# Patient Record
Sex: Female | Born: 1945 | Race: White | Hispanic: No | Marital: Married | Smoking: Former smoker
Health system: Southern US, Community
[De-identification: ages and names within clinical notes are randomized; demographics above are authoritative.]

## PROBLEM LIST (undated history)

## (undated) DIAGNOSIS — M1711 Unilateral primary osteoarthritis, right knee: Secondary | ICD-10-CM

## (undated) DIAGNOSIS — C801 Malignant (primary) neoplasm, unspecified: Secondary | ICD-10-CM

## (undated) DIAGNOSIS — I639 Cerebral infarction, unspecified: Secondary | ICD-10-CM

## (undated) DIAGNOSIS — G4730 Sleep apnea, unspecified: Secondary | ICD-10-CM

## (undated) DIAGNOSIS — E039 Hypothyroidism, unspecified: Secondary | ICD-10-CM

## (undated) DIAGNOSIS — T8859XA Other complications of anesthesia, initial encounter: Secondary | ICD-10-CM

## (undated) DIAGNOSIS — I219 Acute myocardial infarction, unspecified: Secondary | ICD-10-CM

## (undated) DIAGNOSIS — M1990 Unspecified osteoarthritis, unspecified site: Secondary | ICD-10-CM

## (undated) DIAGNOSIS — I2510 Atherosclerotic heart disease of native coronary artery without angina pectoris: Secondary | ICD-10-CM

## (undated) DIAGNOSIS — G4733 Obstructive sleep apnea (adult) (pediatric): Secondary | ICD-10-CM

## (undated) DIAGNOSIS — E162 Hypoglycemia, unspecified: Secondary | ICD-10-CM

## (undated) DIAGNOSIS — J45909 Unspecified asthma, uncomplicated: Secondary | ICD-10-CM

## (undated) DIAGNOSIS — L570 Actinic keratosis: Secondary | ICD-10-CM

## (undated) DIAGNOSIS — I499 Cardiac arrhythmia, unspecified: Secondary | ICD-10-CM

## (undated) DIAGNOSIS — U071 COVID-19: Secondary | ICD-10-CM

## (undated) DIAGNOSIS — K649 Unspecified hemorrhoids: Secondary | ICD-10-CM

## (undated) HISTORY — PX: NECK SURGERY: SHX720

## (undated) HISTORY — PX: MENISCUS REPAIR: SHX5179

## (undated) HISTORY — PX: TONSILLECTOMY: SUR1361

## (undated) HISTORY — PX: TRIGGER FINGER RELEASE: SHX641

## (undated) HISTORY — PX: TENDON REPAIR: SHX5111

## (undated) HISTORY — PX: KNEE ARTHROSCOPY: SUR90

## (undated) HISTORY — PX: CARDIAC CATHETERIZATION: SHX172

## (undated) HISTORY — DX: Actinic keratosis: L57.0

## (undated) HISTORY — PX: CARPAL TUNNEL RELEASE: SHX101

## (undated) HISTORY — PX: BREAST CYST ASPIRATION: SHX578

---

## 2007-02-12 ENCOUNTER — Ambulatory Visit: Payer: Self-pay | Admitting: Gastroenterology

## 2007-07-11 ENCOUNTER — Ambulatory Visit: Payer: Self-pay

## 2007-09-05 DIAGNOSIS — C4492 Squamous cell carcinoma of skin, unspecified: Secondary | ICD-10-CM

## 2007-09-05 HISTORY — DX: Squamous cell carcinoma of skin, unspecified: C44.92

## 2008-07-15 ENCOUNTER — Ambulatory Visit: Payer: Self-pay

## 2009-01-09 ENCOUNTER — Ambulatory Visit: Payer: Self-pay | Admitting: Internal Medicine

## 2009-05-02 DIAGNOSIS — C801 Malignant (primary) neoplasm, unspecified: Secondary | ICD-10-CM

## 2009-05-02 HISTORY — DX: Malignant (primary) neoplasm, unspecified: C80.1

## 2009-07-21 ENCOUNTER — Ambulatory Visit: Payer: Self-pay

## 2010-02-26 ENCOUNTER — Ambulatory Visit: Payer: Self-pay | Admitting: Specialist

## 2010-08-17 ENCOUNTER — Ambulatory Visit: Payer: Self-pay

## 2010-09-02 ENCOUNTER — Ambulatory Visit: Payer: Self-pay | Admitting: Specialist

## 2010-12-27 ENCOUNTER — Other Ambulatory Visit: Payer: Self-pay | Admitting: General Practice

## 2011-01-17 ENCOUNTER — Ambulatory Visit: Payer: Self-pay | Admitting: Specialist

## 2011-08-15 ENCOUNTER — Ambulatory Visit: Payer: Self-pay | Admitting: Internal Medicine

## 2011-08-18 ENCOUNTER — Ambulatory Visit: Payer: Self-pay | Admitting: Internal Medicine

## 2011-10-17 ENCOUNTER — Ambulatory Visit: Payer: Self-pay | Admitting: Unknown Physician Specialty

## 2011-11-08 ENCOUNTER — Ambulatory Visit: Payer: Self-pay | Admitting: Unknown Physician Specialty

## 2012-03-15 LAB — HM PAP SMEAR: HM Pap smear: NORMAL

## 2012-06-12 ENCOUNTER — Ambulatory Visit: Payer: Self-pay

## 2012-06-18 ENCOUNTER — Ambulatory Visit: Payer: Self-pay

## 2012-08-09 ENCOUNTER — Ambulatory Visit: Payer: Self-pay | Admitting: Obstetrics and Gynecology

## 2013-08-12 ENCOUNTER — Ambulatory Visit: Payer: Self-pay | Admitting: Internal Medicine

## 2013-11-20 ENCOUNTER — Ambulatory Visit: Payer: Self-pay | Admitting: Obstetrics and Gynecology

## 2013-11-30 DIAGNOSIS — E039 Hypothyroidism, unspecified: Secondary | ICD-10-CM | POA: Insufficient documentation

## 2013-11-30 DIAGNOSIS — G4733 Obstructive sleep apnea (adult) (pediatric): Secondary | ICD-10-CM | POA: Insufficient documentation

## 2013-11-30 DIAGNOSIS — M1990 Unspecified osteoarthritis, unspecified site: Secondary | ICD-10-CM | POA: Insufficient documentation

## 2014-08-12 ENCOUNTER — Encounter: Payer: Self-pay | Admitting: *Deleted

## 2014-10-08 ENCOUNTER — Other Ambulatory Visit: Payer: Self-pay | Admitting: Internal Medicine

## 2014-10-08 DIAGNOSIS — R7303 Prediabetes: Secondary | ICD-10-CM

## 2014-10-08 DIAGNOSIS — R200 Anesthesia of skin: Secondary | ICD-10-CM

## 2014-10-08 HISTORY — DX: Prediabetes: R73.03

## 2014-10-10 ENCOUNTER — Ambulatory Visit
Admission: RE | Admit: 2014-10-10 | Discharge: 2014-10-10 | Disposition: A | Discharge status: Home / Self Care | Payer: PPO | Source: Ambulatory Visit | Attending: Internal Medicine | Admitting: Internal Medicine

## 2014-10-10 DIAGNOSIS — I6782 Cerebral ischemia: Secondary | ICD-10-CM | POA: Insufficient documentation

## 2014-10-10 DIAGNOSIS — R200 Anesthesia of skin: Secondary | ICD-10-CM

## 2014-10-10 DIAGNOSIS — Z8673 Personal history of transient ischemic attack (TIA), and cerebral infarction without residual deficits: Secondary | ICD-10-CM | POA: Insufficient documentation

## 2014-10-10 MED ORDER — GADOBENATE DIMEGLUMINE 529 MG/ML IV SOLN
15.0000 mL | Freq: Once | INTRAVENOUS | Status: AC | PRN
  Administered 2014-10-10: 14 mL via INTRAVENOUS

## 2014-10-20 ENCOUNTER — Other Ambulatory Visit: Payer: Self-pay | Admitting: Neurology

## 2014-10-20 DIAGNOSIS — M79601 Pain in right arm: Secondary | ICD-10-CM

## 2014-10-20 DIAGNOSIS — M542 Cervicalgia: Secondary | ICD-10-CM

## 2014-10-22 ENCOUNTER — Ambulatory Visit
Admission: RE | Admit: 2014-10-22 | Discharge: 2014-10-22 | Disposition: A | Discharge status: Home / Self Care | Payer: PPO | Source: Ambulatory Visit | Attending: Neurology | Admitting: Neurology

## 2014-10-22 DIAGNOSIS — M542 Cervicalgia: Secondary | ICD-10-CM

## 2014-10-22 DIAGNOSIS — G9589 Other specified diseases of spinal cord: Secondary | ICD-10-CM | POA: Diagnosis not present

## 2014-10-22 DIAGNOSIS — M79601 Pain in right arm: Secondary | ICD-10-CM

## 2014-10-22 DIAGNOSIS — R200 Anesthesia of skin: Secondary | ICD-10-CM | POA: Diagnosis present

## 2014-10-22 DIAGNOSIS — Z981 Arthrodesis status: Secondary | ICD-10-CM | POA: Insufficient documentation

## 2014-10-22 DIAGNOSIS — M4802 Spinal stenosis, cervical region: Secondary | ICD-10-CM | POA: Insufficient documentation

## 2015-01-01 ENCOUNTER — Other Ambulatory Visit: Payer: Self-pay | Admitting: Internal Medicine

## 2015-01-01 DIAGNOSIS — N63 Unspecified lump in unspecified breast: Secondary | ICD-10-CM

## 2015-01-14 ENCOUNTER — Ambulatory Visit
Admission: RE | Admit: 2015-01-14 | Discharge: 2015-01-14 | Disposition: A | Discharge status: Home / Self Care | Payer: PPO | Source: Ambulatory Visit | Attending: Internal Medicine | Admitting: Internal Medicine

## 2015-01-14 ENCOUNTER — Other Ambulatory Visit: Payer: Self-pay | Admitting: Internal Medicine

## 2015-01-14 DIAGNOSIS — N63 Unspecified lump in unspecified breast: Secondary | ICD-10-CM

## 2015-01-14 DIAGNOSIS — R928 Other abnormal and inconclusive findings on diagnostic imaging of breast: Secondary | ICD-10-CM | POA: Diagnosis not present

## 2015-01-14 HISTORY — DX: Malignant (primary) neoplasm, unspecified: C80.1

## 2015-04-25 DIAGNOSIS — M5412 Radiculopathy, cervical region: Secondary | ICD-10-CM | POA: Insufficient documentation

## 2015-05-20 DIAGNOSIS — G5601 Carpal tunnel syndrome, right upper limb: Secondary | ICD-10-CM | POA: Diagnosis not present

## 2015-05-20 DIAGNOSIS — R200 Anesthesia of skin: Secondary | ICD-10-CM | POA: Diagnosis not present

## 2015-05-20 DIAGNOSIS — M542 Cervicalgia: Secondary | ICD-10-CM | POA: Diagnosis not present

## 2015-05-27 DIAGNOSIS — G5601 Carpal tunnel syndrome, right upper limb: Secondary | ICD-10-CM | POA: Insufficient documentation

## 2015-05-27 DIAGNOSIS — R200 Anesthesia of skin: Secondary | ICD-10-CM | POA: Diagnosis not present

## 2015-05-27 DIAGNOSIS — M542 Cervicalgia: Secondary | ICD-10-CM | POA: Diagnosis not present

## 2015-05-27 DIAGNOSIS — M5412 Radiculopathy, cervical region: Secondary | ICD-10-CM | POA: Diagnosis not present

## 2016-01-06 DIAGNOSIS — Z0000 Encounter for general adult medical examination without abnormal findings: Secondary | ICD-10-CM | POA: Diagnosis not present

## 2016-01-06 DIAGNOSIS — R739 Hyperglycemia, unspecified: Secondary | ICD-10-CM | POA: Diagnosis not present

## 2016-01-06 DIAGNOSIS — G4733 Obstructive sleep apnea (adult) (pediatric): Secondary | ICD-10-CM | POA: Diagnosis not present

## 2016-01-06 DIAGNOSIS — E039 Hypothyroidism, unspecified: Secondary | ICD-10-CM | POA: Diagnosis not present

## 2016-01-06 DIAGNOSIS — Z1231 Encounter for screening mammogram for malignant neoplasm of breast: Secondary | ICD-10-CM | POA: Diagnosis not present

## 2016-01-06 DIAGNOSIS — Z23 Encounter for immunization: Secondary | ICD-10-CM | POA: Diagnosis not present

## 2016-01-06 DIAGNOSIS — K625 Hemorrhage of anus and rectum: Secondary | ICD-10-CM | POA: Diagnosis not present

## 2016-01-07 DIAGNOSIS — K625 Hemorrhage of anus and rectum: Secondary | ICD-10-CM | POA: Diagnosis not present

## 2016-01-07 DIAGNOSIS — Z0000 Encounter for general adult medical examination without abnormal findings: Secondary | ICD-10-CM | POA: Diagnosis not present

## 2016-01-07 DIAGNOSIS — E039 Hypothyroidism, unspecified: Secondary | ICD-10-CM | POA: Diagnosis not present

## 2016-01-07 DIAGNOSIS — R739 Hyperglycemia, unspecified: Secondary | ICD-10-CM | POA: Diagnosis not present

## 2016-01-11 ENCOUNTER — Other Ambulatory Visit: Payer: Self-pay | Admitting: Internal Medicine

## 2016-01-11 DIAGNOSIS — Z1231 Encounter for screening mammogram for malignant neoplasm of breast: Secondary | ICD-10-CM

## 2016-02-08 DIAGNOSIS — H25011 Cortical age-related cataract, right eye: Secondary | ICD-10-CM | POA: Diagnosis not present

## 2016-02-10 ENCOUNTER — Ambulatory Visit
Admission: RE | Admit: 2016-02-10 | Discharge: 2016-02-10 | Disposition: A | Discharge status: Home / Self Care | Payer: PPO | Source: Ambulatory Visit | Attending: Internal Medicine | Admitting: Internal Medicine

## 2016-02-10 DIAGNOSIS — Z1231 Encounter for screening mammogram for malignant neoplasm of breast: Secondary | ICD-10-CM | POA: Diagnosis not present

## 2016-02-23 DIAGNOSIS — K5909 Other constipation: Secondary | ICD-10-CM | POA: Diagnosis not present

## 2016-02-23 DIAGNOSIS — R1310 Dysphagia, unspecified: Secondary | ICD-10-CM | POA: Diagnosis not present

## 2016-02-23 DIAGNOSIS — K625 Hemorrhage of anus and rectum: Secondary | ICD-10-CM | POA: Diagnosis not present

## 2016-02-23 DIAGNOSIS — R1319 Other dysphagia: Secondary | ICD-10-CM | POA: Insufficient documentation

## 2016-03-09 ENCOUNTER — Ambulatory Visit: Payer: PPO | Admitting: Certified Registered Nurse Anesthetist

## 2016-03-09 ENCOUNTER — Ambulatory Visit
Admission: RE | Admit: 2016-03-09 | Discharge: 2016-03-09 | Disposition: A | Discharge status: Home / Self Care | Payer: PPO | Source: Ambulatory Visit | Attending: Unknown Physician Specialty | Admitting: Unknown Physician Specialty

## 2016-03-09 ENCOUNTER — Encounter: Payer: Self-pay | Admitting: *Deleted

## 2016-03-09 ENCOUNTER — Encounter
Admission: RE | Disposition: A | Discharge status: Home / Self Care | Payer: Self-pay | Source: Ambulatory Visit | Attending: Unknown Physician Specialty

## 2016-03-09 DIAGNOSIS — K640 First degree hemorrhoids: Secondary | ICD-10-CM | POA: Diagnosis not present

## 2016-03-09 DIAGNOSIS — K3189 Other diseases of stomach and duodenum: Secondary | ICD-10-CM | POA: Insufficient documentation

## 2016-03-09 DIAGNOSIS — E039 Hypothyroidism, unspecified: Secondary | ICD-10-CM | POA: Insufficient documentation

## 2016-03-09 DIAGNOSIS — K2950 Unspecified chronic gastritis without bleeding: Secondary | ICD-10-CM | POA: Insufficient documentation

## 2016-03-09 DIAGNOSIS — Z7982 Long term (current) use of aspirin: Secondary | ICD-10-CM | POA: Diagnosis not present

## 2016-03-09 DIAGNOSIS — K5730 Diverticulosis of large intestine without perforation or abscess without bleeding: Secondary | ICD-10-CM | POA: Diagnosis not present

## 2016-03-09 DIAGNOSIS — K648 Other hemorrhoids: Secondary | ICD-10-CM | POA: Diagnosis not present

## 2016-03-09 DIAGNOSIS — Z87891 Personal history of nicotine dependence: Secondary | ICD-10-CM | POA: Insufficient documentation

## 2016-03-09 DIAGNOSIS — Z888 Allergy status to other drugs, medicaments and biological substances status: Secondary | ICD-10-CM | POA: Insufficient documentation

## 2016-03-09 DIAGNOSIS — K2960 Other gastritis without bleeding: Secondary | ICD-10-CM | POA: Diagnosis not present

## 2016-03-09 DIAGNOSIS — G4730 Sleep apnea, unspecified: Secondary | ICD-10-CM | POA: Diagnosis not present

## 2016-03-09 DIAGNOSIS — K625 Hemorrhage of anus and rectum: Secondary | ICD-10-CM | POA: Insufficient documentation

## 2016-03-09 DIAGNOSIS — Z91018 Allergy to other foods: Secondary | ICD-10-CM | POA: Insufficient documentation

## 2016-03-09 DIAGNOSIS — K259 Gastric ulcer, unspecified as acute or chronic, without hemorrhage or perforation: Secondary | ICD-10-CM | POA: Diagnosis not present

## 2016-03-09 DIAGNOSIS — K2970 Gastritis, unspecified, without bleeding: Secondary | ICD-10-CM | POA: Diagnosis not present

## 2016-03-09 DIAGNOSIS — R1310 Dysphagia, unspecified: Secondary | ICD-10-CM | POA: Diagnosis not present

## 2016-03-09 DIAGNOSIS — K2980 Duodenitis without bleeding: Secondary | ICD-10-CM | POA: Insufficient documentation

## 2016-03-09 DIAGNOSIS — Z91012 Allergy to eggs: Secondary | ICD-10-CM | POA: Diagnosis not present

## 2016-03-09 DIAGNOSIS — K2900 Acute gastritis without bleeding: Secondary | ICD-10-CM | POA: Diagnosis not present

## 2016-03-09 DIAGNOSIS — K5790 Diverticulosis of intestine, part unspecified, without perforation or abscess without bleeding: Secondary | ICD-10-CM | POA: Diagnosis not present

## 2016-03-09 DIAGNOSIS — K209 Esophagitis, unspecified: Secondary | ICD-10-CM | POA: Diagnosis not present

## 2016-03-09 HISTORY — DX: Hypoglycemia, unspecified: E16.2

## 2016-03-09 HISTORY — DX: Cardiac arrhythmia, unspecified: I49.9

## 2016-03-09 HISTORY — PX: COLONOSCOPY WITH PROPOFOL: SHX5780

## 2016-03-09 HISTORY — DX: Sleep apnea, unspecified: G47.30

## 2016-03-09 HISTORY — DX: Unspecified hemorrhoids: K64.9

## 2016-03-09 HISTORY — DX: Hypothyroidism, unspecified: E03.9

## 2016-03-09 HISTORY — PX: ESOPHAGOGASTRODUODENOSCOPY (EGD) WITH PROPOFOL: SHX5813

## 2016-03-09 SURGERY — COLONOSCOPY WITH PROPOFOL
Anesthesia: General

## 2016-03-09 MED ORDER — KETAMINE HCL 50 MG/ML IJ SOLN
INTRAMUSCULAR | Status: DC | PRN
  Administered 2016-03-09 (×2): 12.5 mg via INTRAMUSCULAR
  Administered 2016-03-09: 25 mg via INTRAMUSCULAR

## 2016-03-09 MED ORDER — MIDAZOLAM HCL 2 MG/2ML IJ SOLN
INTRAMUSCULAR | Status: DC | PRN
  Administered 2016-03-09: 4 mg via INTRAVENOUS
  Administered 2016-03-09: 1 mg via INTRAVENOUS

## 2016-03-09 MED ORDER — SODIUM CHLORIDE 0.9 % IV SOLN
INTRAVENOUS | Status: DC
Start: 2016-03-09 — End: 2016-03-09
  Administered 2016-03-09: 1000 mL via INTRAVENOUS

## 2016-03-09 MED ORDER — BUTAMBEN-TETRACAINE-BENZOCAINE 2-2-14 % EX AERO
INHALATION_SPRAY | CUTANEOUS | Status: AC
  Filled 2016-03-09: qty 20

## 2016-03-09 MED ORDER — FENTANYL CITRATE (PF) 100 MCG/2ML IJ SOLN
INTRAMUSCULAR | Status: DC | PRN
  Administered 2016-03-09 (×4): 50 ug via INTRAVENOUS

## 2016-03-09 NOTE — Anesthesia Postprocedure Evaluation (Signed)
Anesthesia Post Note  Patient: Bridget Howe  Procedure(s) Performed: Procedure(s) (LRB): COLONOSCOPY WITH PROPOFOL (N/A) ESOPHAGOGASTRODUODENOSCOPY (EGD) WITH PROPOFOL (N/A)  Patient location during evaluation: PACU Anesthesia Type: General Level of consciousness: awake Pain management: pain level controlled Vital Signs Assessment: post-procedure vital signs reviewed and stable Respiratory status: spontaneous breathing Cardiovascular status: stable Anesthetic complications: no    Last Vitals:  Vitals:   03/09/16 1438 03/09/16 1448  BP: 130/75 136/75  Pulse: (!) 59 (!) 54  Resp: 18 12  Temp:      Last Pain:  Vitals:   03/09/16 1418  TempSrc: Tympanic                 VAN STAVEREN,Rollen Selders

## 2016-03-09 NOTE — Anesthesia Preprocedure Evaluation (Signed)
Anesthesia Evaluation  Patient identified by MRN, date of birth, ID band Patient awake    Reviewed: Allergy & Precautions, NPO status , Patient's Chart, lab work & pertinent test results  Airway Mallampati: II       Dental  (+) Teeth Intact   Pulmonary sleep apnea , former smoker,    breath sounds clear to auscultation       Cardiovascular Exercise Tolerance: Good  Rhythm:Regular Rate:Normal     Neuro/Psych    GI/Hepatic negative GI ROS, Neg liver ROS, (+)     (-) substance abuse  ,   Endo/Other  Hypothyroidism   Renal/GU      Musculoskeletal   Abdominal Normal abdominal exam  (+)   Peds  Hematology negative hematology ROS (+)   Anesthesia Other Findings   Reproductive/Obstetrics                             Anesthesia Physical Anesthesia Plan  ASA: II  Anesthesia Plan: General   Post-op Pain Management:    Induction: Intravenous  Airway Management Planned: Natural Airway and Nasal Cannula  Additional Equipment:   Intra-op Plan:   Post-operative Plan:   Informed Consent: I have reviewed the patients History and Physical, chart, labs and discussed the procedure including the risks, benefits and alternatives for the proposed anesthesia with the patient or authorized representative who has indicated his/her understanding and acceptance.     Plan Discussed with: CRNA  Anesthesia Plan Comments:         Anesthesia Quick Evaluation

## 2016-03-09 NOTE — Transfer of Care (Signed)
Immediate Anesthesia Transfer of Care Note  Patient: Bridget Howe  Procedure(s) Performed: Procedure(s): COLONOSCOPY WITH PROPOFOL (N/A) ESOPHAGOGASTRODUODENOSCOPY (EGD) WITH PROPOFOL (N/A)  Patient Location: PACU  Anesthesia Type:General  Level of Consciousness: awake and alert   Airway & Oxygen Therapy: Patient Spontanous Breathing and Patient connected to nasal cannula oxygen  Post-op Assessment: Report given to RN and Post -op Vital signs reviewed and stable  Post vital signs: Reviewed and stable  Last Vitals:  Vitals:   03/09/16 1243 03/09/16 1418  BP: (!) 158/88 132/82  Pulse: 62 (!) 58  Resp: (!) 22 13  Temp: 36.3 C (!) 36 C    Last Pain:  Vitals:   03/09/16 1418  TempSrc: Tympanic         Complications: No apparent anesthesia complications

## 2016-03-09 NOTE — Op Note (Signed)
[facility] Patient Name: Bridget Howe Procedure Date: 03/09/2016 1:23 PM MRN: [id number] Account #: [id number] Date of Birth: [birth year] Admit Type: Outpatient Age: 70 Room: [facility] ENDO ROOM 4 Gender: Female Note Status: Finalized Procedure:            Upper GI endoscopy Indications:          Dysphagia, Heartburn Providers:            Manya Silvas, MD Referring MD:         Ocie Cornfield. Anderson MD, MD (Referring MD) Medicines:            Fentanyl 100 micrograms IV, Midazolam 5 mg IV, Ketamine                        per Anesthesia Complications:        No immediate complications. Procedure:            Pre-Anesthesia Assessment:                       - After reviewing the risks and benefits, the patient                        was deemed in satisfactory condition to undergo the                        procedure.                       After obtaining informed consent, the endoscope was                        passed under direct vision. Throughout the procedure,                        the patient's blood pressure, pulse, and oxygen                        saturations were monitored continuously. The Endoscope                        was introduced through the mouth, and advanced to the                        second part of duodenum. The upper GI endoscopy was                        accomplished without difficulty. The patient tolerated                        the procedure well. Findings:      Localized mild mucosal changes characterized by inflammation were found       at the gastroesophageal junction. Biopsies were taken with a cold       forceps for histology.      Multiple dispersed, diminutive non-bleeding erosions were found in the       gastric antrum. There were no stigmata of recent bleeding.      Patchy mild inflammation characterized by erythema was found in the       duodenal bulb. At the end of the procedure I passed a 48 F Savary  dilator over a guide wire.      Diffuse and patchy mild inflammation characterized by erythema and       granularity was found in the duodenal bulb. Impression:           - Inflamed mucosa in the esophagus. Biopsied.                       - Non-bleeding erosive gastropathy.                       - Duodenitis. Recommendation:       - Await pathology results. Manya Silvas, MD 03/09/2016 1:56:25 PM This report has been signed electronically. Number of Addenda: 0 Note Initiated On: 03/09/2016 1:23 PM      [facility]

## 2016-03-09 NOTE — Op Note (Signed)
[facility] Patient Name: Bridget Howe Procedure Date: 03/09/2016 1:23 PM MRN: [id number] Account #: [id number] Date of Birth: Dec 07, 1945 Admit Type: Outpatient Age: 70 Room: [facility] ENDO ROOM 4 Gender: Female Note Status: Finalized Procedure:            Colonoscopy Indications:          Rectal bleeding Providers:            Manya Silvas, MD Referring MD:         Ocie Cornfield. Anderson MD, MD (Referring MD) Medicines:            Fentanyl 100 micrograms IV, Propofol per Anesthesia Complications:        No immediate complications. Procedure:            Pre-Anesthesia Assessment:                       - After reviewing the risks and benefits, the patient                        was deemed in satisfactory condition to undergo the                        procedure.                       After obtaining informed consent, the colonoscope was                        passed under direct vision. Throughout the procedure,                        the patient's blood pressure, pulse, and oxygen                        saturations were monitored continuously. The                        Colonoscope was introduced through the anus and                        advanced to the the cecum, identified by appendiceal                        orifice and ileocecal valve. The colonoscopy was                        performed without difficulty. The patient tolerated the                        procedure well. The quality of the bowel preparation                        was good. Findings:      Multiple small and large-mouthed diverticula were found in the sigmoid       colon, descending colon, transverse colon and ascending colon.      Internal hemorrhoids were found during endoscopy. The hemorrhoids were       small and Grade I (internal hemorrhoids that do not prolapse).      The exam was otherwise without abnormality. Impression:           -  Diverticulosis in the  sigmoid colon, in the                        descending colon, in the transverse colon and in the                        ascending colon.                       - Internal hemorrhoids.                       - The examination was otherwise normal.                       - No specimens collected. Recommendation:       - Repeat colonoscopy in 10 years for screening purposes. Manya Silvas, MD 03/09/2016 2:18:36 PM This report has been signed electronically. Number of Addenda: 0 Note Initiated On: 03/09/2016 1:23 PM Scope Withdrawal Time: 0 hours 10 minutes 0 seconds  Total Procedure Duration: 0 hours 14 minutes 29 seconds       [facility]

## 2016-03-09 NOTE — Anesthesia Procedure Notes (Signed)
Date/Time: 03/09/2016 1:26 PM Performed by: Johnna Acosta Pre-anesthesia Checklist: Patient identified, Emergency Drugs available, Suction available, Patient being monitored and Timeout performed Patient Re-evaluated:Patient Re-evaluated prior to inductionOxygen Delivery Method: Nasal cannula

## 2016-03-10 ENCOUNTER — Encounter: Payer: Self-pay | Admitting: Unknown Physician Specialty

## 2016-03-11 LAB — SURGICAL PATHOLOGY

## 2017-01-09 ENCOUNTER — Other Ambulatory Visit: Payer: Self-pay | Admitting: Internal Medicine

## 2017-01-09 DIAGNOSIS — Z1231 Encounter for screening mammogram for malignant neoplasm of breast: Secondary | ICD-10-CM

## 2017-01-18 DIAGNOSIS — E039 Hypothyroidism, unspecified: Secondary | ICD-10-CM | POA: Diagnosis not present

## 2017-01-18 DIAGNOSIS — Z1231 Encounter for screening mammogram for malignant neoplasm of breast: Secondary | ICD-10-CM | POA: Diagnosis not present

## 2017-01-18 DIAGNOSIS — R739 Hyperglycemia, unspecified: Secondary | ICD-10-CM | POA: Diagnosis not present

## 2017-01-18 DIAGNOSIS — G4733 Obstructive sleep apnea (adult) (pediatric): Secondary | ICD-10-CM | POA: Diagnosis not present

## 2017-01-18 DIAGNOSIS — Z0000 Encounter for general adult medical examination without abnormal findings: Secondary | ICD-10-CM | POA: Diagnosis not present

## 2017-01-20 DIAGNOSIS — R739 Hyperglycemia, unspecified: Secondary | ICD-10-CM | POA: Diagnosis not present

## 2017-01-20 DIAGNOSIS — Z0000 Encounter for general adult medical examination without abnormal findings: Secondary | ICD-10-CM | POA: Diagnosis not present

## 2017-01-20 DIAGNOSIS — E039 Hypothyroidism, unspecified: Secondary | ICD-10-CM | POA: Diagnosis not present

## 2017-01-28 IMAGING — MG MM DIGITAL SCREENING BILAT W/ TOMO W/ CAD
8 of 12 series · 8 of 28 positions shown · non-contrast
Comparison: Previous exam(s).

CLINICAL DATA: Screening.

EXAM:
2D DIGITAL SCREENING BILATERAL MAMMOGRAM WITH CAD AND ADJUNCT TOMO

[R MLO synth-2D]
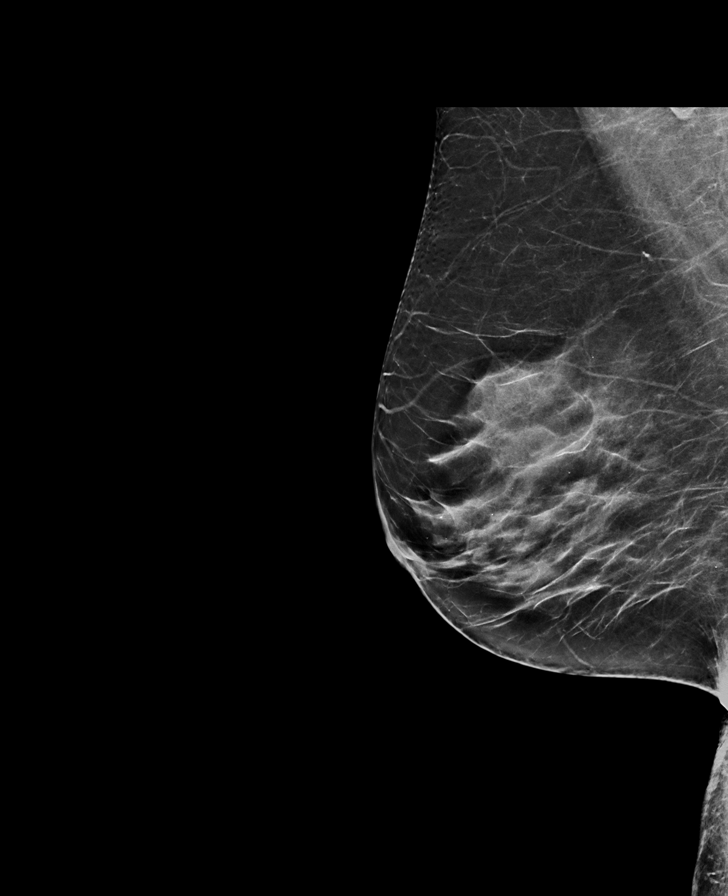

[L CC synth-2D]
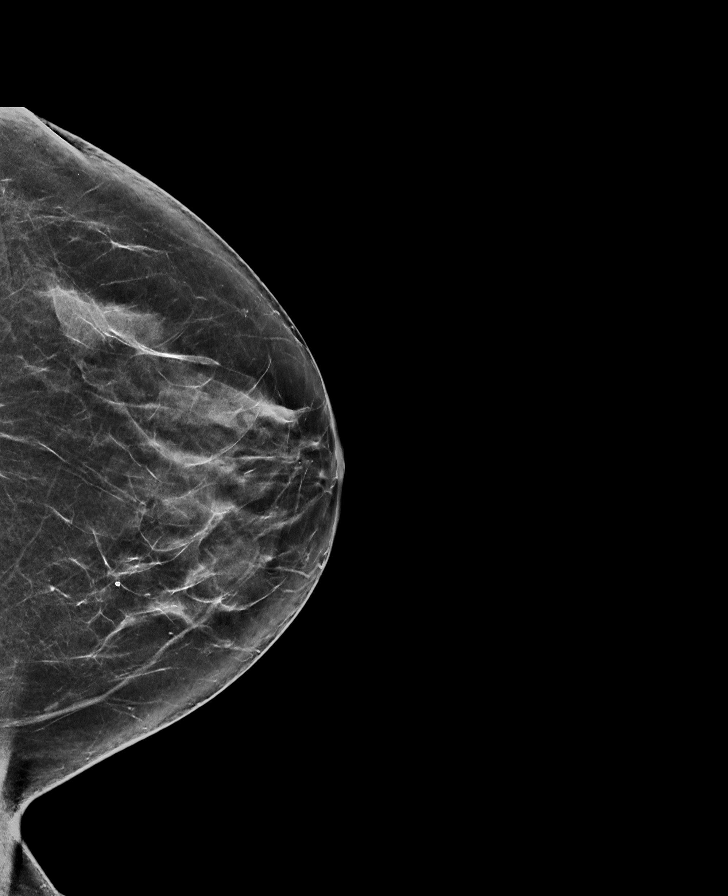

[L CC]
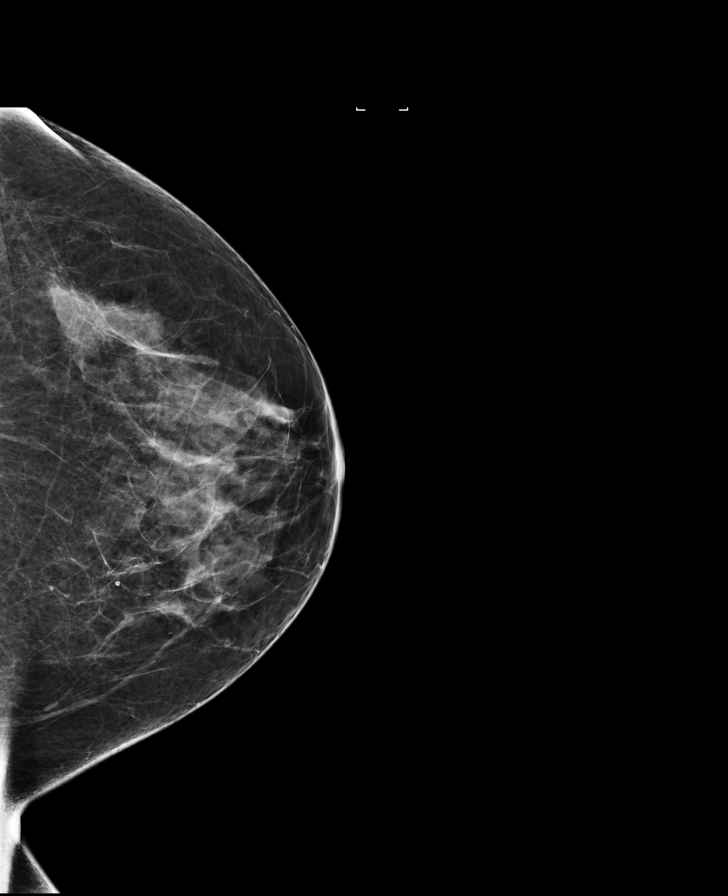

[L MLO synth-2D]
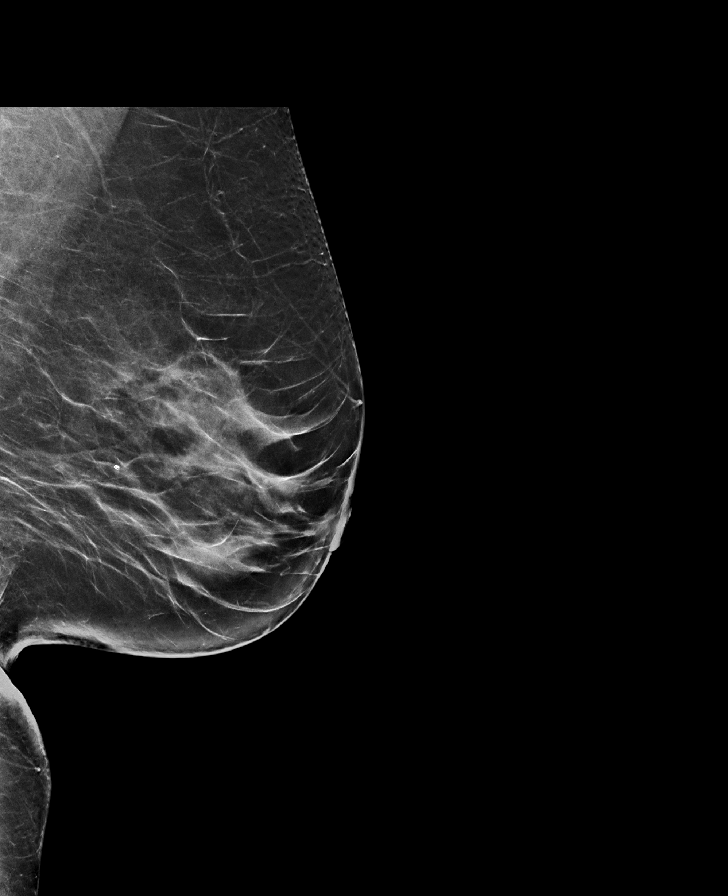

[R CC]
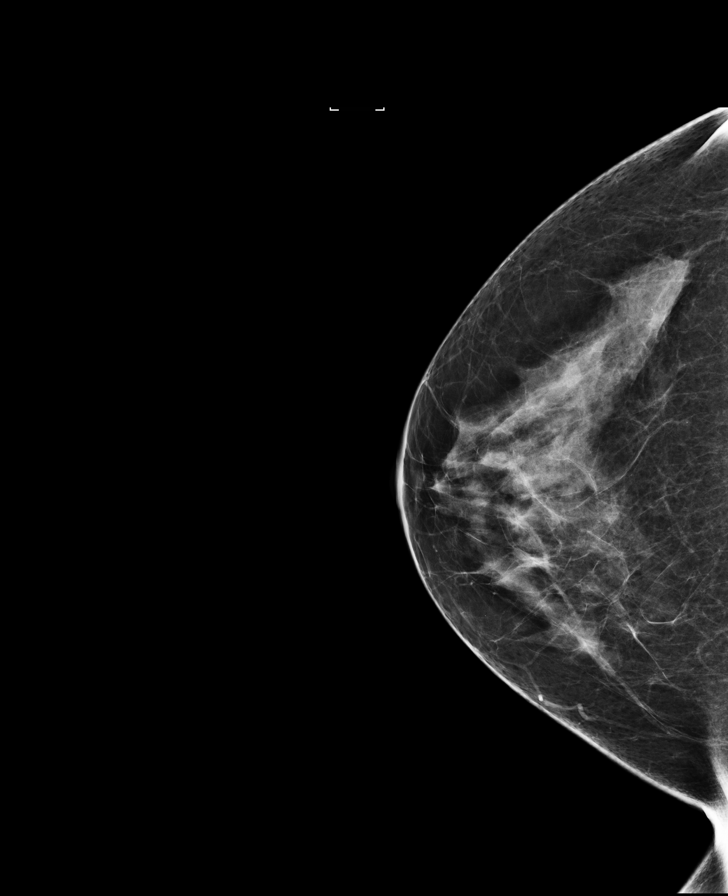

[R MLO]
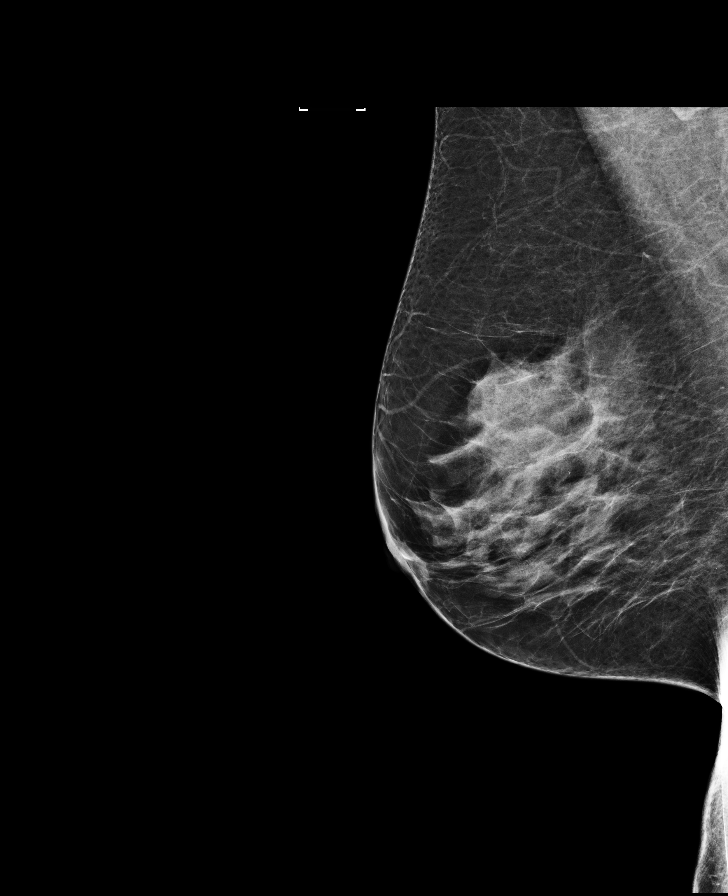

[R CC synth-2D]
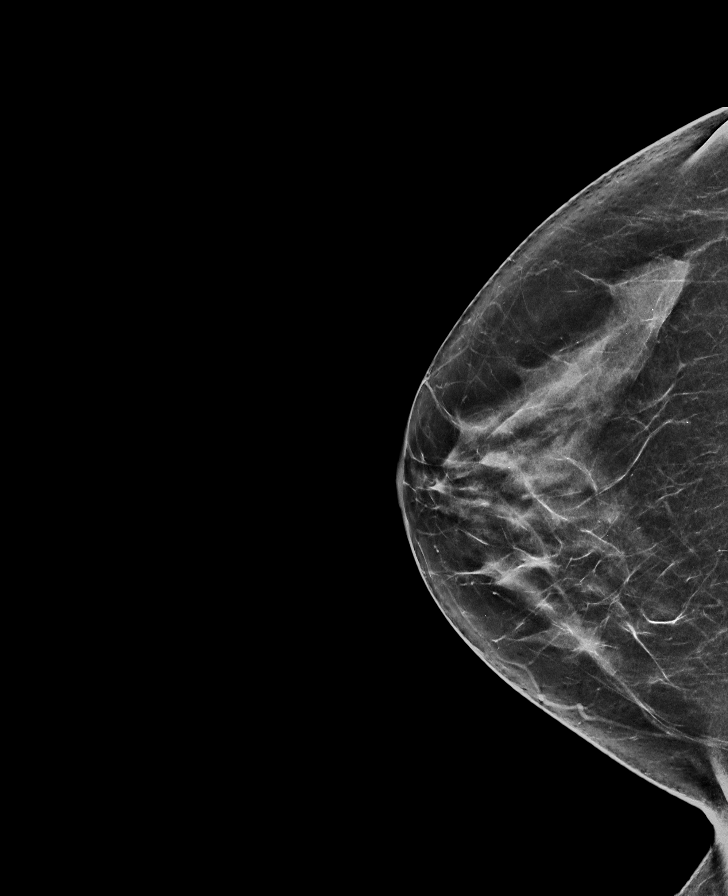

[L MLO]
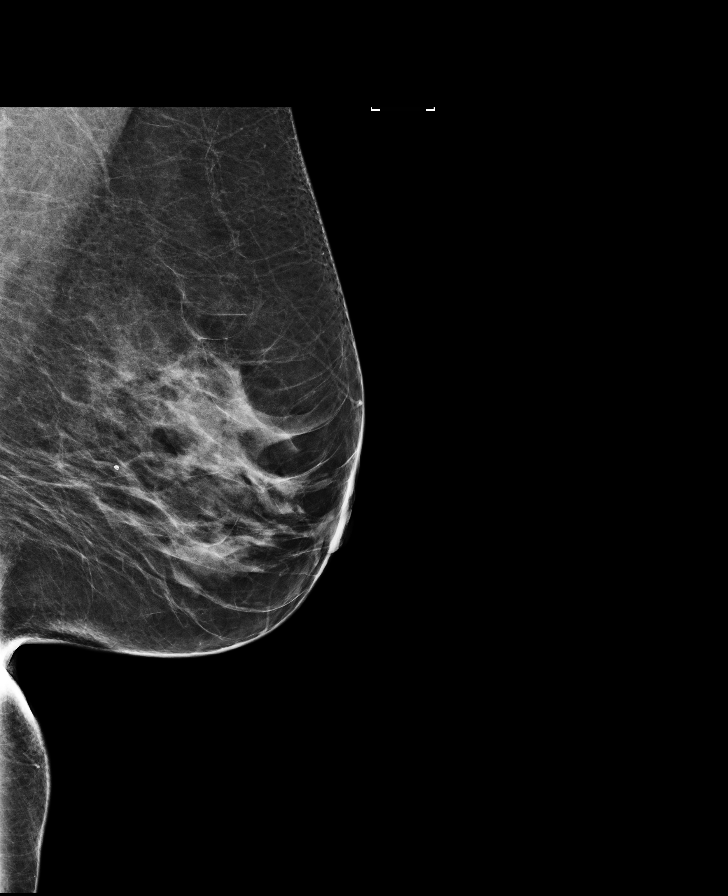

[8 of 28 positions shown; findings below may reference images not displayed]

ACR Breast Density Category c: The breast tissue is heterogeneously
dense, which may obscure small masses.
FINDINGS: There are no findings suspicious for malignancy. Images were
processed with CAD.
IMPRESSION: No mammographic evidence of malignancy. A result letter of this
screening mammogram will be mailed directly to the patient.

RECOMMENDATION:
Screening mammogram in one year. (Code:TN-[id number])

BI-RADS CATEGORY  1: Negative.

## 2017-02-17 ENCOUNTER — Ambulatory Visit
Admission: RE | Admit: 2017-02-17 | Discharge: 2017-02-17 | Disposition: A | Discharge status: Home / Self Care | Payer: PPO | Source: Ambulatory Visit | Attending: Internal Medicine | Admitting: Internal Medicine

## 2017-02-17 DIAGNOSIS — Z1231 Encounter for screening mammogram for malignant neoplasm of breast: Secondary | ICD-10-CM | POA: Diagnosis not present

## 2017-03-13 DIAGNOSIS — G4733 Obstructive sleep apnea (adult) (pediatric): Secondary | ICD-10-CM | POA: Diagnosis not present

## 2017-05-30 ENCOUNTER — Encounter: Payer: Self-pay | Admitting: Obstetrics and Gynecology

## 2017-05-30 ENCOUNTER — Ambulatory Visit (INDEPENDENT_AMBULATORY_CARE_PROVIDER_SITE_OTHER): Payer: PPO | Admitting: Obstetrics and Gynecology

## 2017-05-30 VITALS — BP 128/84 | Ht 62.0 in | Wt 165.0 lb

## 2017-05-30 DIAGNOSIS — Z1331 Encounter for screening for depression: Secondary | ICD-10-CM

## 2017-05-30 DIAGNOSIS — Z01419 Encounter for gynecological examination (general) (routine) without abnormal findings: Secondary | ICD-10-CM | POA: Diagnosis not present

## 2017-05-30 DIAGNOSIS — Z124 Encounter for screening for malignant neoplasm of cervix: Secondary | ICD-10-CM

## 2017-05-30 DIAGNOSIS — Z1339 Encounter for screening examination for other mental health and behavioral disorders: Secondary | ICD-10-CM

## 2017-05-30 NOTE — Progress Notes (Signed)
Routine Annual Gynecology Examination   PCP: Kirk Ruths, MD  Chief Complaint  Patient presents with  . Annual Exam   History of Present Illness: Patient is a 72 y.o. G3P3003 presents for annual exam. The patient has no complaints today.   Menopausal bleeding: denies  Menopausal symptoms: denies  Breast symptoms: denies. Her PCP performs regular breast exams and she has had one recently with no concerns. She defers an exam today.  Last pap smear: 4 years ago.  Result Normal  Last mammogram: 02/17/17 years ago.  Result Normal  Past Medical History:  Diagnosis Date  . Cancer ([place]) 2011   squammous cell skin cancer  . Dysrhythmia   . Hemorrhoids   . Hypoglycemia   . Hypothyroidism   . Sleep apnea     Past Surgical History:  Procedure Laterality Date  . BREAST CYST ASPIRATION Bilateral    3 on right, 2 on the left  . CARDIAC CATHETERIZATION    . CARPAL TUNNEL RELEASE Bilateral   . COLONOSCOPY WITH PROPOFOL N/A 03/09/2016   Procedure: COLONOSCOPY WITH PROPOFOL;  Surgeon: Manya Silvas, MD;  Location: [facility] ENDOSCOPY;  Service: Endoscopy;  Laterality: N/A;  . ESOPHAGOGASTRODUODENOSCOPY (EGD) WITH PROPOFOL N/A 03/09/2016   Procedure: ESOPHAGOGASTRODUODENOSCOPY (EGD) WITH PROPOFOL;  Surgeon: Manya Silvas, MD;  Location: [facility] ENDOSCOPY;  Service: Endoscopy;  Laterality: N/A;  . KNEE ARTHROSCOPY Right   . NECK SURGERY    . TONSILLECTOMY      Prior to Admission medications   Medication Sig Start Date End Date Taking? Authorizing Provider  acetaminophen (TYLENOL) 500 MG tablet Take 500 mg by mouth every 4 (four) hours as needed.   Yes [provider]  diclofenac sodium (VOLTAREN) 1 % GEL Apply 4 g topically 4 (four) times daily.   Yes [provider]  fluticasone (FLONASE) 50 MCG/ACT nasal spray Place into both nostrils daily.   Yes [provider]  levothyroxine (SYNTHROID, LEVOTHROID) 50 MCG tablet Take 50 mcg by mouth daily before  breakfast.   Yes [provider]  Misc Natural Products (PYCNOGENOL PLUS PO) Take by mouth.   Yes [provider]  aspirin EC 81 MG tablet Take 81 mg by mouth daily.    [provider]  diphenhydrAMINE (BENADRYL) 25 MG tablet Take 25 mg by mouth at bedtime as needed. Take 1/2 tablet 12.5 mg    [provider]    Allergies  Allergen Reactions  . Caffeine   . Copper-Containing Compounds   . Eggs Or Egg-Derived Products   . Nexium [Esomeprazole]   . Nickel   . Pseudoephedrine Hcl   . Ranitidine Hcl     Obstetric History: [id number], s/p SVD x 3  Social History   Socioeconomic History  . Marital status: Married    Spouse name: Not on file  . Number of children: Not on file  . Years of education: Not on file  . Highest education level: Not on file  Social Needs  . Financial resource strain: Not on file  . Food insecurity - worry: Not on file  . Food insecurity - inability: Not on file  . Transportation needs - medical: Not on file  . Transportation needs - non-medical: Not on file  Occupational History  . Not on file  Tobacco Use  . Smoking status: Former Research scientist (life sciences)  . Smokeless tobacco: Never Used  Substance and Sexual Activity  . Alcohol use: No  . Drug use: No  . Sexual activity: Not  Currently    Birth control/protection: Post-menopausal  Other Topics Concern  . Not on file  Social History Narrative  . Not on file    Family History  Problem Relation Age of Onset  . Breast cancer Sister 39  . Skin cancer Sister   . Diabetes Mellitus II Sister   . Hypertension Sister   . Hypertension Mother   . Heart failure Mother   . Skin cancer Father   . Autoimmune disease Daughter     Review of Systems  Constitutional: Negative.   HENT: Negative.   Eyes: Negative.   Respiratory: Negative.   Cardiovascular: Negative.   Gastrointestinal: Negative.   Genitourinary: Negative.   Musculoskeletal: Negative.   Skin: Negative.   Neurological:  Negative.   Psychiatric/Behavioral: Negative.      Physical Exam Vitals: BP 128/84   Ht 5\' 2"  (1.575 m)   Wt 165 lb (74.8 kg)   BMI 30.18 kg/m   Physical Exam  Constitutional: She is oriented to person, place, and time. She appears well-developed and well-nourished. No distress.  Genitourinary: Uterus normal. Pelvic exam was performed with patient supine. There is no rash, tenderness, lesion or injury on the right labia. There is no rash, tenderness, lesion or injury on the left labia. No erythema, tenderness or bleeding in the vagina. No signs of injury around the vagina. No vaginal discharge found. Right adnexum does not display mass, does not display tenderness and does not display fullness. Left adnexum does not display mass, does not display tenderness and does not display fullness. Cervix does not exhibit motion tenderness, lesion, discharge or polyp.   Uterus is mobile and anteverted. Uterus is not enlarged, tender or exhibiting a mass.  Genitourinary Comments: Cervix appears stenotic  HENT:  Head: Normocephalic and atraumatic.  Eyes: EOM are normal. No scleral icterus.  Neck: Normal range of motion. Neck supple. No thyromegaly present.  Cardiovascular: Normal rate and regular rhythm. Exam reveals no gallop and no friction rub.  No murmur heard. Pulmonary/Chest: Effort normal and breath sounds normal. No respiratory distress. She has no wheezes. She has no rales.  Abdominal: Soft. Bowel sounds are normal. She exhibits no distension and no mass. There is no tenderness. There is no rebound and no guarding.  Musculoskeletal: Normal range of motion. She exhibits no edema or tenderness.  Lymphadenopathy:    She has no cervical adenopathy.       Right: No inguinal adenopathy present.       Left: No inguinal adenopathy present.  Neurological: She is alert and oriented to person, place, and time. No cranial nerve deficit.  Skin: Skin is warm and dry. No rash noted. No erythema.    Psychiatric: She has a normal mood and affect. Her behavior is normal. Judgment normal.    Female chaperone present for pelvic and breast  portions of the physical exam  Results: AUDIT Questionnaire (screen for alcoholism): 0 PHQ-9: 0   Assessment and Plan:  72 y.o. G13P3003 female here for routine annual gynecologic examination  Plan: Problem List Items Addressed This Visit    None    Visit Diagnoses    Women's annual routine gynecological examination    -  Primary   Relevant Orders   Pap IG (Image Guided)   Screening for depression       Screening for alcoholism       Pap smear for cervical cancer screening       Relevant Orders   Pap IG (Image Guided)  Screening: -- Blood pressure screen normal -- Colonoscopy - up to date. Manageed by PCP -- Mammogram - not due -- Weight screening: obese: discussed management options, including lifestyle, dietary, and exercise. -- Depression screening negative (PHQ-9) -- Nutrition: normal -- cholesterol screening: per PCP -- osteoporosis screening: per PCP -- tobacco screening: not using -- alcohol screening: AUDIT questionnaire indicates low-risk usage. -- family history of breast cancer screening: done. not at high risk. -- no evidence of domestic violence or intimate partner violence. -- STD screening: gonorrhea/chlamydia NAAT not collected per patient request. -- pap smear collected per patient request. Discussed recommendations for cutoff for pap smear at age 81 years.  Patient is not sexually active.  I did recommend pelvic exams every 1-2 years while she is still young and healthy.  We can decide together or she can decide with her PCP the age at which she will no longer need pelvic exams. Discussed finding pelvic masses, pelvic skin issues (e.g. Melenoma), as reasons to continue with pelvic exams.  -- flu vaccine did not receive due to egg allergy -- HPV vaccination series: not eligilbe  Prentice Docker, MD 05/31/2017 11:17  AM

## 2017-05-31 ENCOUNTER — Encounter: Payer: Self-pay | Admitting: Obstetrics and Gynecology

## 2017-05-31 LAB — PAP IG (IMAGE GUIDED): PAP SMEAR COMMENT: 0

## 2017-06-02 ENCOUNTER — Encounter: Payer: Self-pay | Admitting: Obstetrics and Gynecology

## 2017-07-13 DIAGNOSIS — M545 Low back pain: Secondary | ICD-10-CM | POA: Diagnosis not present

## 2017-07-13 DIAGNOSIS — D7589 Other specified diseases of blood and blood-forming organs: Secondary | ICD-10-CM | POA: Diagnosis not present

## 2017-07-13 DIAGNOSIS — M47815 Spondylosis without myelopathy or radiculopathy, thoracolumbar region: Secondary | ICD-10-CM | POA: Diagnosis not present

## 2017-07-17 ENCOUNTER — Ambulatory Visit
Admission: RE | Admit: 2017-07-17 | Discharge: 2017-07-17 | Disposition: A | Discharge status: Home / Self Care | Payer: PPO | Source: Ambulatory Visit | Attending: Internal Medicine | Admitting: Internal Medicine

## 2017-07-17 ENCOUNTER — Other Ambulatory Visit: Payer: Self-pay | Admitting: Internal Medicine

## 2017-07-17 DIAGNOSIS — I700 Atherosclerosis of aorta: Secondary | ICD-10-CM | POA: Diagnosis not present

## 2017-07-17 DIAGNOSIS — M4316 Spondylolisthesis, lumbar region: Secondary | ICD-10-CM | POA: Insufficient documentation

## 2017-07-17 DIAGNOSIS — R10A1 Flank pain, right side: Secondary | ICD-10-CM

## 2017-07-17 DIAGNOSIS — R1084 Generalized abdominal pain: Secondary | ICD-10-CM | POA: Diagnosis not present

## 2017-07-17 DIAGNOSIS — R109 Unspecified abdominal pain: Secondary | ICD-10-CM

## 2017-07-17 DIAGNOSIS — M5136 Other intervertebral disc degeneration, lumbar region: Secondary | ICD-10-CM | POA: Diagnosis not present

## 2017-07-17 DIAGNOSIS — K5900 Constipation, unspecified: Secondary | ICD-10-CM | POA: Insufficient documentation

## 2017-07-17 DIAGNOSIS — R1011 Right upper quadrant pain: Secondary | ICD-10-CM

## 2017-07-17 DIAGNOSIS — K5730 Diverticulosis of large intestine without perforation or abscess without bleeding: Secondary | ICD-10-CM | POA: Diagnosis not present

## 2017-07-17 DIAGNOSIS — R112 Nausea with vomiting, unspecified: Secondary | ICD-10-CM | POA: Diagnosis not present

## 2017-07-17 MED ORDER — IOPAMIDOL (ISOVUE-300) INJECTION 61%
100.0000 mL | Freq: Once | INTRAVENOUS | Status: AC | PRN
  Administered 2017-07-17: 100 mL via INTRAVENOUS

## 2018-01-18 ENCOUNTER — Other Ambulatory Visit: Payer: Self-pay | Admitting: Internal Medicine

## 2018-01-18 DIAGNOSIS — Z1231 Encounter for screening mammogram for malignant neoplasm of breast: Secondary | ICD-10-CM

## 2018-02-19 ENCOUNTER — Ambulatory Visit
Admission: RE | Admit: 2018-02-19 | Discharge: 2018-02-19 | Disposition: A | Discharge status: Home / Self Care | Payer: PPO | Source: Ambulatory Visit | Attending: Internal Medicine | Admitting: Internal Medicine

## 2018-02-19 DIAGNOSIS — Z1231 Encounter for screening mammogram for malignant neoplasm of breast: Secondary | ICD-10-CM | POA: Diagnosis not present

## 2018-05-17 DIAGNOSIS — E039 Hypothyroidism, unspecified: Secondary | ICD-10-CM | POA: Diagnosis not present

## 2018-05-17 DIAGNOSIS — G4733 Obstructive sleep apnea (adult) (pediatric): Secondary | ICD-10-CM | POA: Diagnosis not present

## 2018-05-17 DIAGNOSIS — R739 Hyperglycemia, unspecified: Secondary | ICD-10-CM | POA: Diagnosis not present

## 2018-05-17 DIAGNOSIS — Z0000 Encounter for general adult medical examination without abnormal findings: Secondary | ICD-10-CM | POA: Diagnosis not present

## 2018-05-17 DIAGNOSIS — D751 Secondary polycythemia: Secondary | ICD-10-CM | POA: Diagnosis not present

## 2018-05-23 DIAGNOSIS — Z0000 Encounter for general adult medical examination without abnormal findings: Secondary | ICD-10-CM | POA: Diagnosis not present

## 2018-05-23 DIAGNOSIS — E039 Hypothyroidism, unspecified: Secondary | ICD-10-CM | POA: Diagnosis not present

## 2018-05-23 DIAGNOSIS — R739 Hyperglycemia, unspecified: Secondary | ICD-10-CM | POA: Diagnosis not present

## 2018-05-23 DIAGNOSIS — D751 Secondary polycythemia: Secondary | ICD-10-CM | POA: Diagnosis not present

## 2019-05-10 DIAGNOSIS — H04222 Epiphora due to insufficient drainage, left lacrimal gland: Secondary | ICD-10-CM | POA: Diagnosis not present

## 2019-05-14 DIAGNOSIS — L570 Actinic keratosis: Secondary | ICD-10-CM | POA: Diagnosis not present

## 2019-05-14 DIAGNOSIS — Z85828 Personal history of other malignant neoplasm of skin: Secondary | ICD-10-CM | POA: Diagnosis not present

## 2019-05-14 DIAGNOSIS — L219 Seborrheic dermatitis, unspecified: Secondary | ICD-10-CM | POA: Diagnosis not present

## 2019-05-28 DIAGNOSIS — L570 Actinic keratosis: Secondary | ICD-10-CM | POA: Diagnosis not present

## 2019-05-28 DIAGNOSIS — E039 Hypothyroidism, unspecified: Secondary | ICD-10-CM | POA: Diagnosis not present

## 2019-05-28 DIAGNOSIS — G4733 Obstructive sleep apnea (adult) (pediatric): Secondary | ICD-10-CM | POA: Diagnosis not present

## 2019-05-28 DIAGNOSIS — Z85828 Personal history of other malignant neoplasm of skin: Secondary | ICD-10-CM | POA: Diagnosis not present

## 2019-05-28 DIAGNOSIS — Z0000 Encounter for general adult medical examination without abnormal findings: Secondary | ICD-10-CM | POA: Diagnosis not present

## 2019-05-28 DIAGNOSIS — L905 Scar conditions and fibrosis of skin: Secondary | ICD-10-CM | POA: Diagnosis not present

## 2019-05-28 DIAGNOSIS — L219 Seborrheic dermatitis, unspecified: Secondary | ICD-10-CM | POA: Diagnosis not present

## 2019-05-28 DIAGNOSIS — R739 Hyperglycemia, unspecified: Secondary | ICD-10-CM | POA: Diagnosis not present

## 2019-05-30 DIAGNOSIS — E039 Hypothyroidism, unspecified: Secondary | ICD-10-CM | POA: Diagnosis not present

## 2019-05-30 DIAGNOSIS — R739 Hyperglycemia, unspecified: Secondary | ICD-10-CM | POA: Diagnosis not present

## 2019-06-04 ENCOUNTER — Other Ambulatory Visit: Payer: Self-pay | Admitting: Internal Medicine

## 2019-06-04 DIAGNOSIS — Z1231 Encounter for screening mammogram for malignant neoplasm of breast: Secondary | ICD-10-CM

## 2019-06-22 ENCOUNTER — Ambulatory Visit: Payer: PPO | Attending: Internal Medicine

## 2019-06-22 DIAGNOSIS — Z23 Encounter for immunization: Secondary | ICD-10-CM

## 2019-06-22 NOTE — Progress Notes (Signed)
   Covid-19 Vaccination Clinic  Name:  JYL REIFER    MRN: [id number] DOB: 07/29/1945  06/22/2019  Ms. Cannata was observed post Covid-19 immunization for 15 minutes without incidence. She was provided with Vaccine Information Sheet and instruction to access the V-Safe system.   Ms. Caywood was instructed to call 911 with any severe reactions post vaccine: Marland Kitchen Difficulty breathing  . Swelling of your face and throat  . A fast heartbeat  . A bad rash all over your body  . Dizziness and weakness    Immunizations Administered    Name Date Dose VIS Date Route   Pfizer COVID-19 Vaccine 06/22/2019  2:14 PM 0.3 mL 04/12/2019 Intramuscular   Manufacturer: [place]   Lot: [id number]   [place]: [id number]

## 2019-07-02 DIAGNOSIS — Z85828 Personal history of other malignant neoplasm of skin: Secondary | ICD-10-CM | POA: Diagnosis not present

## 2019-07-02 DIAGNOSIS — L578 Other skin changes due to chronic exposure to nonionizing radiation: Secondary | ICD-10-CM | POA: Diagnosis not present

## 2019-07-02 DIAGNOSIS — L219 Seborrheic dermatitis, unspecified: Secondary | ICD-10-CM | POA: Diagnosis not present

## 2019-07-02 DIAGNOSIS — L820 Inflamed seborrheic keratosis: Secondary | ICD-10-CM | POA: Diagnosis not present

## 2019-07-02 DIAGNOSIS — L821 Other seborrheic keratosis: Secondary | ICD-10-CM | POA: Diagnosis not present

## 2019-07-02 DIAGNOSIS — L570 Actinic keratosis: Secondary | ICD-10-CM | POA: Diagnosis not present

## 2019-07-09 ENCOUNTER — Ambulatory Visit
Admission: RE | Admit: 2019-07-09 | Discharge: 2019-07-09 | Disposition: A | Discharge status: Home / Self Care | Payer: PPO | Source: Ambulatory Visit | Attending: Internal Medicine | Admitting: Internal Medicine

## 2019-07-09 DIAGNOSIS — Z1231 Encounter for screening mammogram for malignant neoplasm of breast: Secondary | ICD-10-CM | POA: Insufficient documentation

## 2019-07-16 ENCOUNTER — Ambulatory Visit: Payer: PPO | Attending: Internal Medicine

## 2019-07-16 DIAGNOSIS — Z23 Encounter for immunization: Secondary | ICD-10-CM

## 2019-07-16 NOTE — Progress Notes (Signed)
   Covid-19 Vaccination Clinic  Name:  Bridget Howe    MRN: [id number] DOB: [birth year]  07/16/2019  [name] was observed post Covid-19 immunization for 15 minutes without incident. She was provided with Vaccine Information Sheet and instruction to access the V-Safe system.   [name] was instructed to call 911 with any severe reactions post vaccine: Marland Kitchen Difficulty breathing  . Swelling of face and throat  . A fast heartbeat  . A bad rash all over body  . Dizziness and weakness   Immunizations Administered    Name Date Dose VIS Date Route   Pfizer COVID-19 Vaccine 07/16/2019 12:11 PM 0.3 mL 04/12/2019 Intramuscular   Manufacturer: [place]   Lot: [id number]   [place]: [id number]

## 2019-07-31 ENCOUNTER — Ambulatory Visit: Payer: PPO

## 2019-08-29 ENCOUNTER — Ambulatory Visit: Payer: PPO

## 2019-10-07 ENCOUNTER — Ambulatory Visit: Payer: PPO | Admitting: Dermatology

## 2020-02-03 ENCOUNTER — Ambulatory Visit: Payer: PPO | Admitting: Dermatology

## 2020-02-26 ENCOUNTER — Other Ambulatory Visit: Payer: Self-pay

## 2020-02-26 ENCOUNTER — Ambulatory Visit: Payer: PPO | Admitting: Dermatology

## 2020-02-26 ENCOUNTER — Encounter: Payer: Self-pay | Admitting: Dermatology

## 2020-02-26 DIAGNOSIS — L249 Irritant contact dermatitis, unspecified cause: Secondary | ICD-10-CM

## 2020-02-26 DIAGNOSIS — Z85828 Personal history of other malignant neoplasm of skin: Secondary | ICD-10-CM | POA: Diagnosis not present

## 2020-02-26 DIAGNOSIS — L219 Seborrheic dermatitis, unspecified: Secondary | ICD-10-CM | POA: Diagnosis not present

## 2020-02-26 DIAGNOSIS — L570 Actinic keratosis: Secondary | ICD-10-CM

## 2020-02-26 DIAGNOSIS — L578 Other skin changes due to chronic exposure to nonionizing radiation: Secondary | ICD-10-CM

## 2020-02-26 NOTE — Progress Notes (Signed)
   Follow-Up Visit   Subjective  Bridget Howe is a 74 y.o. female who presents for the following: Actinic Keratosis (Face. Dur: several months. Hx of LN2 in the past.) and Skin Cancer (Left medial canthus 2011, right dorsal hand 2009. ). She used [place] under eye and rash cleared up.  She was changed to Tacrolimus due to insurance issues and she had an allergic reaction.    The following portions of the chart were reviewed this encounter and updated as appropriate:     Review of Systems: No other skin or systemic complaints except as noted in HPI or Assessment and Plan.  Objective  Well appearing patient in no apparent distress; mood and affect are within normal limits.  A focused examination was performed including face, arms, hands. Relevant physical exam findings are noted in the Assessment and Plan.       Objective  Left medial lower canthus: Pink patch with greasy scale.   Images    Objective  forehead x1, left tip nose x1, right cheek x1, right temple x3, left forehead x3, right upper cutaneous lip x1, left upper chest x1, right forearm x1 (12): Erythematous thin papules/macules with gritty scale.   Objective  Right Wrist - Posterior: Erythematous patch.   Assessment & Plan  Seborrheic dermatitis Left medial lower canthus  Recurrence off treatment restart Eucrisa oint. BID PRN. Samples only x 3 given today, since insurance won't cover  If not improving with Eucrisa, recommend biopsy to r/o recurrent SCC Pt was instructed to RTC if area doesn't clear up.  AK (actinic keratosis) (12) forehead x1, left tip nose x1, right cheek x1, right temple x3, left forehead x3, right upper cutaneous lip x1, left upper chest x1, right forearm x1  Discussed field treatment and recommend PDT to face x 2 once healed  Destruction of lesion - forehead x1, left tip nose x1, right cheek x1, right temple x3, left forehead x3, right upper cutaneous lip x1, left upper chest x1,  right forearm x1  Destruction method: cryotherapy   Informed consent: discussed and consent obtained   Lesion destroyed using liquid nitrogen: Yes   Region frozen until ice ball extended beyond lesion: Yes   Outcome: patient tolerated procedure well with no complications   Post-procedure details: wound care instructions given    Irritant contact dermatitis, unspecified trigger Right Wrist - Posterior  Due to Fitbit watch, pt has stopped wearing it. Use OTC HC cream BID PRN. Can also use Eucrisa BID PRN.  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 - No evidence of recurrence today - Recommend regular full body skin exams - Recommend daily broad spectrum sunscreen SPF 30+ to sun-exposed areas, reapply every 2 hours as needed.  - Call if any new or changing lesions are noted between office visits  Actinic Damage - diffuse scaly erythematous macules with underlying dyspigmentation - Recommend daily broad spectrum sunscreen SPF 30+ to sun-exposed areas, reapply every 2 hours as needed.  - Call for new or changing lesions. Plan PDT Tx to face fall/winter months. Discussed with patient the procedure in depth today, including risks/benefits.  Return in about 6 months (around 08/26/2020) for AK recheck, recheck L med canthus.   I, Emelia Salisbury, CMA, am acting as scribe for Brendolyn Patty, MD.  Documentation: I have reviewed the above documentation for accuracy and completeness, and I agree with the above.  Brendolyn Patty MD

## 2020-02-26 NOTE — Patient Instructions (Addendum)
Cryotherapy Aftercare  . Wash gently with soap and water everyday.   Marland Kitchen Apply Vaseline and Band-Aid daily until healed.  Prior to procedure, discussed risks of blister formation, small wound, skin dyspigmentation, or rare scar following cryotherapy.   Levulan/PDT Treatment Common Side Effects  - Burning/stinging, which may be severe and last up to 24-72 hours after your treatment  - Redness, swelling and/or peeling which may last up to 4 weeks  - Scaling/crusting which may last up to 2 weeks  - Sun sensitivity (you MUST avoid sun exposure for 48-72 hours after treatment)  Care Instructions  - Okay to wash with soap and water and shampoo as normal  - If needed, you can do a cold compress (ex. Ice packs) for comfort  - If okay with your Primary Doctor, you may use analgesics such as Tylenol every 4-6 hours, not to exceed recommended dose  - You may apply Cerave Healing Ointment, Vaseline or Aquaphor  - If you have a lot of swelling you may take a Benadryl to help with this (this may cause drowsiness)  Sun Precautions  - Wear a wide brim hat for the next week if outside  - Wear a sunblock with zinc or titanium dioxide at least SPF 50 daily   We will recheck you in 10-12 weeks. If any problems, please call the office and ask to speak with a nurse.

## 2020-03-31 DIAGNOSIS — J069 Acute upper respiratory infection, unspecified: Secondary | ICD-10-CM | POA: Diagnosis not present

## 2020-05-28 DIAGNOSIS — E039 Hypothyroidism, unspecified: Secondary | ICD-10-CM | POA: Diagnosis not present

## 2020-05-28 DIAGNOSIS — R7303 Prediabetes: Secondary | ICD-10-CM | POA: Diagnosis not present

## 2020-05-28 DIAGNOSIS — G4733 Obstructive sleep apnea (adult) (pediatric): Secondary | ICD-10-CM | POA: Diagnosis not present

## 2020-05-28 DIAGNOSIS — E162 Hypoglycemia, unspecified: Secondary | ICD-10-CM | POA: Diagnosis not present

## 2020-05-28 DIAGNOSIS — Z0000 Encounter for general adult medical examination without abnormal findings: Secondary | ICD-10-CM | POA: Diagnosis not present

## 2020-05-29 DIAGNOSIS — G4733 Obstructive sleep apnea (adult) (pediatric): Secondary | ICD-10-CM | POA: Diagnosis not present

## 2020-05-29 DIAGNOSIS — R7303 Prediabetes: Secondary | ICD-10-CM | POA: Diagnosis not present

## 2020-05-29 DIAGNOSIS — E039 Hypothyroidism, unspecified: Secondary | ICD-10-CM | POA: Diagnosis not present

## 2020-07-13 ENCOUNTER — Other Ambulatory Visit: Payer: Self-pay | Admitting: Internal Medicine

## 2020-07-13 DIAGNOSIS — Z1231 Encounter for screening mammogram for malignant neoplasm of breast: Secondary | ICD-10-CM

## 2020-07-31 ENCOUNTER — Other Ambulatory Visit: Payer: Self-pay

## 2020-07-31 ENCOUNTER — Ambulatory Visit
Admission: RE | Admit: 2020-07-31 | Discharge: 2020-07-31 | Disposition: A | Discharge status: Home / Self Care | Payer: PPO | Source: Ambulatory Visit | Attending: Internal Medicine | Admitting: Internal Medicine

## 2020-07-31 DIAGNOSIS — Z1231 Encounter for screening mammogram for malignant neoplasm of breast: Secondary | ICD-10-CM | POA: Insufficient documentation

## 2020-09-01 ENCOUNTER — Ambulatory Visit: Payer: PPO | Admitting: Dermatology

## 2020-10-05 ENCOUNTER — Ambulatory Visit: Payer: PPO | Admitting: Dermatology

## 2020-10-05 ENCOUNTER — Other Ambulatory Visit: Payer: Self-pay

## 2020-10-05 ENCOUNTER — Encounter: Payer: Self-pay | Admitting: Dermatology

## 2020-10-05 DIAGNOSIS — L578 Other skin changes due to chronic exposure to nonionizing radiation: Secondary | ICD-10-CM | POA: Diagnosis not present

## 2020-10-05 DIAGNOSIS — L570 Actinic keratosis: Secondary | ICD-10-CM

## 2020-10-05 DIAGNOSIS — L219 Seborrheic dermatitis, unspecified: Secondary | ICD-10-CM

## 2020-10-05 DIAGNOSIS — L821 Other seborrheic keratosis: Secondary | ICD-10-CM | POA: Diagnosis not present

## 2020-10-05 NOTE — Patient Instructions (Signed)

## 2020-10-05 NOTE — Progress Notes (Signed)
   Follow-Up Visit   Subjective  Bridget Howe is a 75 y.o. female who presents for the following: Actinic Keratosis (Recheck previously tx AK's of the forehead, L tip nose x 1, R cheek x 1, R temple x 3, L forehead x 3, R upper cutaneous lip x 1, L upper chest x 1, R forearm x 1) and seb derm (Of the L med lower canthus - used Eucrisa samples in the past, patient thinks area has improved).  The following portions of the chart were reviewed this encounter and updated as appropriate:      Review of Systems:  No other skin or systemic complaints except as noted in HPI or Assessment and Plan.  Objective  Well appearing patient in no apparent distress; mood and affect are within normal limits.  A focused examination was performed including the face, arms, left leg, and chest. Relevant physical exam findings are noted in the Assessment and Plan.  Objective  R zygoma x 1, L mid cheek x 1, L nasal dorsum x 1, central upper lip x 1, R upper sternum x 1, L upper sternum x 1 (6): Erythematous thin papules/macules with gritty scale.   Objective  L med lower canthus: Clear today.  Objective  L upper ant thigh: Waxy tan papule  Assessment & Plan  AK (actinic keratosis) (6) R zygoma x 1, L mid cheek x 1, L nasal dorsum x 1, central upper lip x 1, R upper sternum x 1, L upper sternum x 1  Destruction of lesion - R zygoma x 1, L mid cheek x 1, L nasal dorsum x 1, central upper lip x 1, R upper sternum x 1, L upper sternum x 1  Destruction method: cryotherapy   Informed consent: discussed and consent obtained   Timeout:  patient name, date of birth, surgical site, and procedure verified Lesion destroyed using liquid nitrogen: Yes   Region frozen until ice ball extended beyond lesion: Yes   Outcome: patient tolerated procedure well with no complications   Post-procedure details: wound care instructions given    Seborrheic dermatitis L med lower canthus  Clear today. 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 RTC PRN recurrence if doesn't clear with topical. Sample given of Eucrisa apply to aa QD-BID PRN.   Seborrheic keratosis L upper ant thigh  Reassured benign age-related growth.  Recommend observation.  Discussed cryotherapy if spot(s) become irritated or inflamed.      Actinic Damage - chronic, secondary to cumulative UV radiation exposure/sun exposure over time - diffuse scaly erythematous macules with underlying dyspigmentation - Recommend daily broad spectrum sunscreen SPF 30+ to sun-exposed areas, reapply every 2 hours as needed.  - Recommend staying in the shade or wearing long sleeves, sun glasses (UVA+UVB protection) and wide brim hats (4-inch brim around the entire circumference of the hat). - Call for new or changing lesions.  Return in about 6 months (around 04/06/2021) for AK follow up, recheck L med canthus.  Luther Redo, CMA, am acting as scribe for Brendolyn Patty, MD .  Documentation: I have reviewed the above documentation for accuracy and completeness, and I agree with the above.  Brendolyn Patty MD

## 2021-04-12 ENCOUNTER — Ambulatory Visit: Payer: PPO | Admitting: Dermatology

## 2021-04-14 ENCOUNTER — Ambulatory Visit: Payer: PPO | Admitting: Dermatology

## 2021-04-14 ENCOUNTER — Encounter: Payer: Self-pay | Admitting: Dermatology

## 2021-04-14 ENCOUNTER — Other Ambulatory Visit: Payer: Self-pay

## 2021-04-14 DIAGNOSIS — L578 Other skin changes due to chronic exposure to nonionizing radiation: Secondary | ICD-10-CM | POA: Diagnosis not present

## 2021-04-14 DIAGNOSIS — L219 Seborrheic dermatitis, unspecified: Secondary | ICD-10-CM | POA: Diagnosis not present

## 2021-04-14 DIAGNOSIS — L821 Other seborrheic keratosis: Secondary | ICD-10-CM

## 2021-04-14 DIAGNOSIS — Z872 Personal history of diseases of the skin and subcutaneous tissue: Secondary | ICD-10-CM | POA: Diagnosis not present

## 2021-04-14 DIAGNOSIS — Z85828 Personal history of other malignant neoplasm of skin: Secondary | ICD-10-CM

## 2021-04-14 DIAGNOSIS — L570 Actinic keratosis: Secondary | ICD-10-CM

## 2021-04-14 NOTE — Patient Instructions (Addendum)
Cryotherapy Aftercare  Wash gently with soap and water everyday.   Apply Vaseline and Band-Aid daily until healed.    Seborrheic Keratosis  What causes seborrheic keratoses? Seborrheic keratoses are harmless, common skin growths that first appear during adult life.  As time goes by, more growths appear.  Some people may develop a large number of them.  Seborrheic keratoses appear on both covered and uncovered body parts.  They are not caused by sunlight.  The tendency to develop seborrheic keratoses can be inherited.  They vary in color from skin-colored to gray, brown, or even black.  They can be either smooth or have a rough, warty surface.   Seborrheic keratoses are superficial and look as if they were stuck on the skin.  Under the microscope this type of keratosis looks like layers upon layers of skin.  That is why at times the top layer may seem to fall off, but the rest of the growth remains and re-grows.    Treatment Seborrheic keratoses do not need to be treated, but can easily be removed in the office.  Seborrheic keratoses often cause symptoms when they rub on clothing or jewelry.  Lesions can be in the way of shaving.  If they become inflamed, they can cause itching, soreness, or burning.  Removal of a seborrheic keratosis can be accomplished by freezing, burning, or surgery. If any spot bleeds, scabs, or grows rapidly, please return to have it checked, as these can be an indication of a skin cancer.   If You Need Anything After Your Visit  If you have any questions or concerns for your doctor, please call our main line at [phone] and press option 4 to reach your doctor's medical assistant. If no one answers, please leave a voicemail as directed and we will return your call as soon as possible. Messages left after 4 pm will be answered the following business day.   You may also send [place] a message via [place]. We typically respond to MyChart messages within 1-2 business  days.  For prescription refills, please ask your pharmacy to contact our office. Our fax number is [phone].  If you have an urgent issue when the clinic is closed that cannot wait until the next business day, you can page your doctor at the number below.    Please note that while we do our best to be available for urgent issues outside of office hours, we are not available 24/7.   If you have an urgent issue and are unable to reach [place], you may choose to seek medical care at your doctor's office, retail clinic, urgent care center, or emergency room.  If you have a medical emergency, please immediately call 911 or go to the emergency department.  Pager Numbers  - Dr. Nehemiah Massed: [phone]  - Dr. Laurence Ferrari: [phone]  - Dr. Nicole Kindred: [phone]  In the event of inclement weather, please call our main line at [phone] for an update on the status of any delays or closures.  Dermatology Medication Tips: Please keep the boxes that topical medications come in in order to help keep track of the instructions about where and how to use these. Pharmacies typically print the medication instructions only on the boxes and not directly on the medication tubes.   If your medication is too expensive, please contact our office at [phone] option 4 or send [place] a message through [place].   We are unable to tell what your co-pay for medications will be in advance  as this is different depending on your insurance coverage. However, we may be able to find a substitute medication at lower cost or fill out paperwork to get insurance to cover a needed medication.   If a prior authorization is required to get your medication covered by your insurance company, please allow [place] 1-2 business days to complete this process.  Drug prices often vary depending on where the prescription is filled and some pharmacies may offer cheaper prices.  The website www.goodrx.com contains coupons for medications through  different pharmacies. The prices here do not account for what the cost may be with help from insurance (it may be cheaper with your insurance), but the website can give you the price if you did not use any insurance.  - You can print the associated coupon and take it with your prescription to the pharmacy.  - You may also stop by our office during regular business hours and pick up a GoodRx coupon card.  - If you need your prescription sent electronically to a different pharmacy, notify our office through [facility] or by phone at [phone] option 4.     Si Usted Necesita Algo Despus de Su Visita  Tambin puede enviarnos un mensaje a travs de Pharmacist, community. Por lo general respondemos a los mensajes de MyChart en el transcurso de 1 a 2 das hbiles.  Para renovar recetas, por favor pida a su farmacia que se ponga en contacto con nuestra oficina. Harland Dingwall de fax es [place] [phone].  Si tiene un asunto urgente cuando la clnica est cerrada y que no puede esperar hasta el siguiente da hbil, puede llamar/localizar a su doctor(a) al nmero que aparece a continuacin.   Por favor, tenga en cuenta que aunque hacemos todo lo posible para estar disponibles para asuntos urgentes fuera del horario de [place], no estamos disponibles las 24 horas del da, los 7 das de la [place].   Si tiene un problema urgente y no puede comunicarse con nosotros, puede optar por buscar atencin mdica  en el consultorio de su doctor(a), en una clnica privada, en un centro de atencin urgente o en una sala de emergencias.  Si tiene Engineering geologist, por favor llame inmediatamente al 911 o vaya a la sala de emergencias.  Nmeros de bper  - Dr. Nehemiah Massed: [phone]  - Dra. Moye: [phone]  - Dra. Nicole Kindred: [phone]  En caso de inclemencias del [place], por favor llame a Johnsie Kindred principal al [phone] para una actualizacin sobre el [place] de cualquier retraso o cierre.  Consejos  para la medicacin en dermatologa: Por favor, guarde las cajas en las que vienen los medicamentos de uso tpico para ayudarle a seguir las instrucciones sobre dnde y cmo usarlos. Las farmacias generalmente imprimen las instrucciones del medicamento slo en las cajas y no directamente en los tubos del [place].   Si su medicamento es muy caro, por favor, pngase en contacto con Zigmund Daniel llamando al [phone] y presione la opcin 4 o envenos un mensaje a travs de Pharmacist, community.   No podemos decirle cul ser su copago por los medicamentos por adelantado ya que esto es diferente dependiendo de la cobertura de su seguro. Sin embargo, es posible que podamos encontrar un medicamento sustituto a Electrical engineer un formulario para que el seguro cubra el medicamento que se considera necesario.   Si se requiere una autorizacin previa para que su compaa de seguros [place] su medicamento, por favor permtanos de 1 a 2 das  hbiles para completar este proceso.  Los precios de los medicamentos varan con frecuencia dependiendo del Environmental consultant de dnde se surte la receta y alguna farmacias pueden ofrecer precios ms baratos.  El sitio web www.goodrx.com tiene cupones para medicamentos de Airline pilot. Los precios aqu no tienen en cuenta lo que podra costar con la ayuda del seguro (puede ser ms barato con su seguro), pero el sitio web puede darle el precio si no utiliz Research scientist (physical sciences).  - Puede imprimir el cupn correspondiente y llevarlo con su receta a la farmacia.  - Tambin puede pasar por nuestra oficina durante el horario de atencin regular y Charity fundraiser una tarjeta de cupones de GoodRx.  - Si necesita que su receta se enve electrnicamente a una farmacia diferente, informe a nuestra oficina a travs de MyChart de [place] o por telfono llamando al [phone] y presione la opcin 4.

## 2021-04-14 NOTE — Progress Notes (Signed)
Follow-Up Visit   Subjective  Bridget Howe is a 75 y.o. female who presents for the following: Follow-up.  Patient here for 6 month follow-up. She has a history of Aks and history of SCC of the left medial canthus and right hand. She has a new spot on the left lateral brow to check. She has an SK on the left anterior thigh she would like checked. It has raised some, but not irritating. Hx of seb derm of the left medial canthus, improved with sample of Eucrisa ointment.   The following portions of the chart were reviewed this encounter and updated as appropriate:       Review of Systems:  No other skin or systemic complaints except as noted in HPI or Assessment and Plan.  Objective  Well appearing patient in no apparent distress; mood and affect are within normal limits.  A focused examination was performed including face, hands, arms, left leg. Relevant physical exam findings are noted in the Assessment and Plan.  L lat eyebrow x 1, glabella x 3, R nasal dorsum x 1, , R upper eyebrow x 1 (6) Pink scaly macules.  Left Medial Canthus Mild erythema, no scale.   Left Medial Canthus No evidence of recurrence today, prominent glandular tissue L medial mucosal lower eyelid when compared to R medial lower eyelid        Assessment & Plan  Actinic Damage - Severe, confluent actinic changes with pre-cancerous actinic keratoses  - Severe, chronic, not at goal, secondary to cumulative UV radiation exposure over time - diffuse scaly erythematous macules and papules with underlying dyspigmentation - Discussed Prescription "Field Treatment" for Severe, Chronic Confluent Actinic Changes with Pre-Cancerous Actinic Keratoses Field treatment involves treatment of an entire area of skin that has confluent Actinic Changes (Sun/ Ultraviolet light damage) and PreCancerous Actinic Keratoses by method of PhotoDynamic Therapy (PDT) and/or prescription Topical Chemotherapy agents such as  5-fluorouracil, 5-fluorouracil/calcipotriene, and/or imiquimod.  The purpose is to decrease the number of clinically evident and subclinical PreCancerous lesions to prevent progression to development of skin cancer by chemically destroying early precancer changes that may or may not be visible.  It has been shown to reduce the risk of developing skin cancer in the treated area. As a result of treatment, redness, scaling, crusting, and open sores may occur during treatment course. One or more than one of these methods may be used and may have to be used several times to control, suppress and eliminate the PreCancerous changes. Discussed treatment course, expected reaction, and possible side effects. - Recommend daily broad spectrum sunscreen SPF 30+ to sun-exposed areas, reapply every 2 hours as needed.  - Staying in the shade or wearing long sleeves, sun glasses (UVA+UVB protection) and wide brim hats (4-inch brim around the entire circumference of the hat) are also recommended. - Call for new or changing lesions.  History of Squamous Cell Carcinoma of the Skin - No evidence of recurrence today of the left medial canthus and right dorsal hand - No lymphadenopathy - Recommend regular full body skin exams - Recommend daily broad spectrum sunscreen SPF 30+ to sun-exposed areas, reapply every 2 hours as needed.  - Call if any new or changing lesions are noted between office visits AK (actinic keratosis) (6) L lat eyebrow x 1, glabella x 3, R nasal dorsum x 1, , R upper eyebrow x 1  Actinic keratoses are precancerous spots that appear secondary to cumulative UV radiation exposure/sun exposure over time. They are  chronic with expected duration over 1 year. A portion of actinic keratoses will progress to squamous cell carcinoma of the skin. It is not possible to reliably predict which spots will progress to skin cancer and so treatment is recommended to prevent development of skin cancer.  Recommend daily  broad spectrum sunscreen SPF 30+ to sun-exposed areas, reapply every 2 hours as needed.  Recommend staying in the shade or wearing long sleeves, sun glasses (UVA+UVB protection) and wide brim hats (4-inch brim around the entire circumference of the hat). Call for new or changing lesions.  Destruction of lesion - L lat eyebrow x 1, glabella x 3, R nasal dorsum x 1, , R upper eyebrow x 1  Destruction method: cryotherapy   Informed consent: discussed and consent obtained   Lesion destroyed using liquid nitrogen: Yes   Region frozen until ice ball extended beyond lesion: Yes   Outcome: patient tolerated procedure well with no complications   Post-procedure details: wound care instructions given   Additional details:  Prior to procedure, discussed risks of blister formation, small wound, skin dyspigmentation, or rare scar following cryotherapy. Recommend Vaseline ointment to treated areas while healing.   Seborrheic dermatitis Left Medial Canthus  Clear today. Pt has h/o SCC in same area many years ago. RTC PRN recurrence if doesn't clear with topical. Sample given of Eucrisa apply to aa QD-BID PRN.  Seborrheic Dermatitis  -  is a chronic persistent rash characterized by pinkness and scaling most commonly of the mid face but also can occur on the scalp (dandruff), ears; mid chest, mid back and groin.  It tends to be exacerbated by stress and cooler weather.  People who have neurologic disease may experience new onset or exacerbation of existing seborrheic dermatitis.  The condition is not curable but treatable and can be controlled.   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  Clear. Observe for recurrence. Call clinic for new or changing lesions.  Recommend regular skin exams, daily broad-spectrum spf 30+ sunscreen use, and photoprotection.   Recommend pt returning to Opthalmology for evaluation since recent h/o  clogged tear duct and slight asymmetry lower medial mucosal eyelid   Seborrheic Keratoses - Stuck-on, waxy, tan-brown papules and/or plaques, including left anterior thigh  - Benign-appearing - Discussed benign etiology and prognosis. - Observe - Call for any changes    Return in about 6 months (around 10/13/2021) for TBSE.  IJamesetta Orleans, CMA, am acting as scribe for Brendolyn Patty, MD . Documentation: I have reviewed the above documentation for accuracy and completeness, and I agree with the above.  Brendolyn Patty MD

## 2021-05-14 DIAGNOSIS — H2511 Age-related nuclear cataract, right eye: Secondary | ICD-10-CM | POA: Diagnosis not present

## 2021-05-31 DIAGNOSIS — E039 Hypothyroidism, unspecified: Secondary | ICD-10-CM | POA: Diagnosis not present

## 2021-05-31 DIAGNOSIS — G4733 Obstructive sleep apnea (adult) (pediatric): Secondary | ICD-10-CM | POA: Diagnosis not present

## 2021-05-31 DIAGNOSIS — Z0000 Encounter for general adult medical examination without abnormal findings: Secondary | ICD-10-CM | POA: Diagnosis not present

## 2021-05-31 DIAGNOSIS — R7303 Prediabetes: Secondary | ICD-10-CM | POA: Diagnosis not present

## 2021-06-02 DIAGNOSIS — G4733 Obstructive sleep apnea (adult) (pediatric): Secondary | ICD-10-CM | POA: Diagnosis not present

## 2021-06-02 DIAGNOSIS — E039 Hypothyroidism, unspecified: Secondary | ICD-10-CM | POA: Diagnosis not present

## 2021-06-02 DIAGNOSIS — Z0000 Encounter for general adult medical examination without abnormal findings: Secondary | ICD-10-CM | POA: Diagnosis not present

## 2021-06-02 DIAGNOSIS — R7303 Prediabetes: Secondary | ICD-10-CM | POA: Diagnosis not present

## 2021-09-08 ENCOUNTER — Other Ambulatory Visit: Payer: Self-pay | Admitting: Internal Medicine

## 2021-09-08 DIAGNOSIS — Z1231 Encounter for screening mammogram for malignant neoplasm of breast: Secondary | ICD-10-CM

## 2021-09-09 ENCOUNTER — Ambulatory Visit
Admission: RE | Admit: 2021-09-09 | Discharge: 2021-09-09 | Disposition: A | Discharge status: Home / Self Care | Payer: PPO | Source: Ambulatory Visit | Attending: Internal Medicine | Admitting: Internal Medicine

## 2021-09-09 DIAGNOSIS — Z1231 Encounter for screening mammogram for malignant neoplasm of breast: Secondary | ICD-10-CM | POA: Insufficient documentation

## 2021-09-14 DIAGNOSIS — G4733 Obstructive sleep apnea (adult) (pediatric): Secondary | ICD-10-CM | POA: Diagnosis not present

## 2021-10-13 DIAGNOSIS — H00025 Hordeolum internum left lower eyelid: Secondary | ICD-10-CM | POA: Diagnosis not present

## 2021-10-18 ENCOUNTER — Encounter: Payer: Self-pay | Admitting: Dermatology

## 2021-10-18 ENCOUNTER — Ambulatory Visit: Payer: PPO | Admitting: Dermatology

## 2021-10-18 DIAGNOSIS — D1800 Hemangioma unspecified site: Secondary | ICD-10-CM | POA: Diagnosis not present

## 2021-10-18 DIAGNOSIS — L570 Actinic keratosis: Secondary | ICD-10-CM

## 2021-10-18 DIAGNOSIS — D229 Melanocytic nevi, unspecified: Secondary | ICD-10-CM | POA: Diagnosis not present

## 2021-10-18 DIAGNOSIS — L820 Inflamed seborrheic keratosis: Secondary | ICD-10-CM

## 2021-10-18 DIAGNOSIS — Z85828 Personal history of other malignant neoplasm of skin: Secondary | ICD-10-CM | POA: Diagnosis not present

## 2021-10-18 DIAGNOSIS — L720 Epidermal cyst: Secondary | ICD-10-CM

## 2021-10-18 DIAGNOSIS — L578 Other skin changes due to chronic exposure to nonionizing radiation: Secondary | ICD-10-CM

## 2021-10-18 DIAGNOSIS — L814 Other melanin hyperpigmentation: Secondary | ICD-10-CM | POA: Diagnosis not present

## 2021-10-18 DIAGNOSIS — Z1283 Encounter for screening for malignant neoplasm of skin: Secondary | ICD-10-CM | POA: Diagnosis not present

## 2021-10-18 DIAGNOSIS — L821 Other seborrheic keratosis: Secondary | ICD-10-CM | POA: Diagnosis not present

## 2021-10-18 NOTE — Patient Instructions (Addendum)
Cryotherapy Aftercare  Wash gently with soap and water everyday.   Apply Vaseline and Band-Aid daily until healed.   Melanoma ABCDEs  Melanoma is the most dangerous type of skin cancer, and is the leading cause of death from skin disease.  You are more likely to develop melanoma if you: Have light-colored skin, light-colored eyes, or red or blond hair Spend a lot of time in the sun Tan regularly, either outdoors or in a tanning bed Have had blistering sunburns, especially during childhood Have a close family member who has had a melanoma Have atypical moles or large birthmarks  Early detection of melanoma is key since treatment is typically straightforward and cure rates are extremely high if we catch it early.   The first sign of melanoma is often a change in a mole or a new dark spot.  The ABCDE system is a way of remembering the signs of melanoma.  A for asymmetry:  The two halves do not match. B for border:  The edges of the growth are irregular. C for color:  A mixture of colors are present instead of an even brown color. D for diameter:  Melanomas are usually (but not always) greater than 6mm - the size of a pencil eraser. E for evolution:  The spot keeps changing in size, shape, and color.  Please check your skin once per month between visits. You can use a small mirror in front and a large mirror behind you to keep an eye on the back side or your body.   If you see any new or changing lesions before your next follow-up, please call to schedule a visit.  Please continue daily skin protection including broad spectrum sunscreen SPF 30+ to sun-exposed areas, reapplying every 2 hours as needed when you're outdoors.     Due to recent changes in healthcare laws, you may see results of your pathology and/or laboratory studies on MyChart before the doctors have had a chance to review them. We understand that in some cases there may be results that are confusing or concerning to you.  Please understand that not all results are received at the same time and often the doctors may need to interpret multiple results in order to provide you with the best plan of care or course of treatment. Therefore, we ask that you please give us 2 business days to thoroughly review all your results before contacting the office for clarification. Should we see a critical lab result, you will be contacted sooner.   If You Need Anything After Your Visit  If you have any questions or concerns for your doctor, please call our main line at [phone] and press option 4 to reach your doctor's medical assistant. If no one answers, please leave a voicemail as directed and we will return your call as soon as possible. Messages left after 4 pm will be answered the following business day.   You may also send us a message via MyChart. We typically respond to MyChart messages within 1-2 business days.  For prescription refills, please ask your pharmacy to contact our office. Our fax number is [phone].  If you have an urgent issue when the clinic is closed that cannot wait until the next business day, you can page your doctor at the number below.    Please note that while we do our best to be available for urgent issues outside of office hours, we are not available 24/7.   If you have an urgent   issue and are unable to reach us, you may choose to seek medical care at your doctor's office, retail clinic, urgent care center, or emergency room.  If you have a medical emergency, please immediately call 911 or go to the emergency department.  Pager Numbers  - Dr. Kowalski: [phone]  - Dr. Moye: [phone]  - Dr. Stewart: [phone]  In the event of inclement weather, please call our main line at [phone] for an update on the status of any delays or closures.  Dermatology Medication Tips: Please keep the boxes that topical medications come in in order to help keep track of the  instructions about where and how to use these. Pharmacies typically print the medication instructions only on the boxes and not directly on the medication tubes.   If your medication is too expensive, please contact our office at [phone] option 4 or send us a message through MyChart.   We are unable to tell what your co-pay for medications will be in advance as this is different depending on your insurance coverage. However, we may be able to find a substitute medication at lower cost or fill out paperwork to get insurance to cover a needed medication.   If a prior authorization is required to get your medication covered by your insurance company, please allow us 1-2 business days to complete this process.  Drug prices often vary depending on where the prescription is filled and some pharmacies may offer cheaper prices.  The website www.goodrx.com contains coupons for medications through different pharmacies. The prices here do not account for what the cost may be with help from insurance (it may be cheaper with your insurance), but the website can give you the price if you did not use any insurance.  - You can print the associated coupon and take it with your prescription to the pharmacy.  - You may also stop by our office during regular business hours and pick up a GoodRx coupon card.  - If you need your prescription sent electronically to a different pharmacy, notify our office through [facility] MyChart or by phone at [phone] option 4.     Si Usted Necesita Algo Despus de Su Visita  Tambin puede enviarnos un mensaje a travs de MyChart. Por lo general respondemos a los mensajes de MyChart en el transcurso de 1 a 2 das hbiles.  Para renovar recetas, por favor pida a su farmacia que se ponga en contacto con nuestra oficina. Nuestro nmero de fax es el [phone].  Si tiene un asunto urgente cuando la clnica est cerrada y que no puede esperar hasta el siguiente da hbil,  puede llamar/localizar a su doctor(a) al nmero que aparece a continuacin.   Por favor, tenga en cuenta que aunque hacemos todo lo posible para estar disponibles para asuntos urgentes fuera del horario de oficina, no estamos disponibles las 24 horas del da, los 7 das de la semana.   Si tiene un problema urgente y no puede comunicarse con nosotros, puede optar por buscar atencin mdica  en el consultorio de su doctor(a), en una clnica privada, en un centro de atencin urgente o en una sala de emergencias.  Si tiene una emergencia mdica, por favor llame inmediatamente al 911 o vaya a la sala de emergencias.  Nmeros de bper  - Dr. Kowalski: [phone]  - Dra. Moye: [phone]  - Dra. Stewart: [phone]  En caso de inclemencias del tiempo, por favor llame a nuestra lnea principal al [phone] para una actualizacin   sobre el estado de cualquier retraso o cierre.  Consejos para la medicacin en dermatologa: Por favor, guarde las cajas en las que vienen los medicamentos de uso tpico para ayudarle a seguir las instrucciones sobre dnde y cmo usarlos. Las farmacias generalmente imprimen las instrucciones del medicamento slo en las cajas y no directamente en los tubos del medicamento.   Si su medicamento es muy caro, por favor, pngase en contacto con nuestra oficina llamando al [phone] y presione la opcin 4 o envenos un mensaje a travs de MyChart.   No podemos decirle cul ser su copago por los medicamentos por adelantado ya que esto es diferente dependiendo de la cobertura de su seguro. Sin embargo, es posible que podamos encontrar un medicamento sustituto a menor costo o llenar un formulario para que el seguro cubra el medicamento que se considera necesario.   Si se requiere una autorizacin previa para que su compaa de seguros cubra su medicamento, por favor permtanos de 1 a 2 das hbiles para completar este proceso.  Los precios de los medicamentos varan con  frecuencia dependiendo del lugar de dnde se surte la receta y alguna farmacias pueden ofrecer precios ms baratos.  El sitio web www.goodrx.com tiene cupones para medicamentos de diferentes farmacias. Los precios aqu no tienen en cuenta lo que podra costar con la ayuda del seguro (puede ser ms barato con su seguro), pero el sitio web puede darle el precio si no utiliz ningn seguro.  - Puede imprimir el cupn correspondiente y llevarlo con su receta a la farmacia.  - Tambin puede pasar por nuestra oficina durante el horario de atencin regular y recoger una tarjeta de cupones de GoodRx.  - Si necesita que su receta se enve electrnicamente a una farmacia diferente, informe a nuestra oficina a travs de MyChart de [facility] o por telfono llamando al [phone] y presione la opcin 4.  

## 2021-10-18 NOTE — Progress Notes (Signed)
Follow-Up Visit   Subjective  Bridget Howe is a 76 y.o. female who presents for the following: Annual Exam (The patient presents for Total-Body Skin Exam (TBSE) for skin cancer screening and mole check.  The patient has spots, moles and lesions to be evaluated, some may be new or changing and the patient has concerns that these could be cancer. Patient with hx of SCC and AK's. Patient advises she had a small ant bite about 1 year ago and bite site is still visible at arm.).-L forearm   The following portions of the chart were reviewed this encounter and updated as appropriate:       Review of Systems:  No other skin or systemic complaints except as noted in HPI or Assessment and Plan.  Objective  Well appearing patient in no apparent distress; mood and affect are within normal limits.  A full examination was performed including scalp, head, eyes, ears, nose, lips, neck, chest, axillae, abdomen, back, buttocks, bilateral upper extremities, bilateral lower extremities, hands, feet, fingers, toes, fingernails, and toenails. All findings within normal limits unless otherwise noted below.  L zygoma x 1, L temple x 1, nasal dorsum x 3, R temple x 1, L cheek x 1 (7) Erythematous thin papules/macules with gritty scale.   Left medial lower eyelid 7 mm tan slightly waxy macule c/w SK near area of prior SCC, no change when compared to previous photo  lower lipline Smooth white papule(s).   left mid forearm Pink patch, slightly waxy  left upper anterior thigh Waxy tan papule    Assessment & Plan  AK (actinic keratosis) (7) L zygoma x 1, L temple x 1, nasal dorsum x 3, R temple x 1, L cheek x 1  Actinic keratoses are precancerous spots that appear secondary to cumulative UV radiation exposure/sun exposure over time. They are chronic with expected duration over 1 year. A portion of actinic keratoses will progress to squamous cell carcinoma of the skin. It is not possible to reliably  predict which spots will progress to skin cancer and so treatment is recommended to prevent development of skin cancer.  Recommend daily broad spectrum sunscreen SPF 30+ to sun-exposed areas, reapply every 2 hours as needed.  Recommend staying in the shade or wearing long sleeves, sun glasses (UVA+UVB protection) and wide brim hats (4-inch brim around the entire circumference of the hat). Call for new or changing lesions.  Destruction of lesion - L zygoma x 1, L temple x 1, nasal dorsum x 3, R temple x 1, L cheek x 1  Destruction method: cryotherapy   Informed consent: discussed and consent obtained   Lesion destroyed using liquid nitrogen: Yes   Region frozen until ice ball extended beyond lesion: Yes   Outcome: patient tolerated procedure well with no complications   Post-procedure details: wound care instructions given   Additional details:  Prior to procedure, discussed risks of blister formation, small wound, skin dyspigmentation, or rare scar following cryotherapy. Recommend Vaseline ointment to treated areas while healing.   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 Observe for recurrence. Call clinic for new or changing lesions.  Recommend regular skin exams, daily broad-spectrum spf 30+ sunscreen use, and photoprotection.    Photo compared, no change  Pt gets regular f/up with [place] lower lipline  Benign, observe.    Inflamed seborrheic keratosis left mid forearm  Vs AK  vrs [place]  Recheck on f/up  Destruction of lesion - left mid forearm  Destruction method: cryotherapy   Informed consent: discussed and consent obtained   Lesion destroyed using liquid nitrogen: Yes   Region frozen until ice ball extended beyond lesion: Yes   Outcome: patient tolerated procedure well with no complications   Post-procedure details: wound care instructions given   Additional  details:  Prior to procedure, discussed risks of blister formation, small wound, skin dyspigmentation, or rare scar following cryotherapy. Recommend Vaseline ointment to treated areas while healing.   Seborrheic keratosis left upper anterior thigh  Reassured benign age-related growth.  Recommend observation.  Discussed cryotherapy if spot(s) become irritated or inflamed.  History of Squamous Cell Carcinoma of the Skin - No evidence of recurrence today at right dorsal hand and left medial canthus - Recommend regular full body skin exams - Recommend daily broad spectrum sunscreen SPF 30+ to sun-exposed areas, reapply every 2 hours as needed.  - Call if any new or changing lesions are noted between office visits  Lentigines - Scattered tan macules - Due to sun exposure - Benign-appearing, observe - Recommend daily broad spectrum sunscreen SPF 30+ to sun-exposed areas, reapply every 2 hours as needed. - Call for any changes  Seborrheic Keratoses - Stuck-on, waxy, tan-brown papules and/or plaques  - Benign-appearing - Discussed benign etiology and prognosis. - Observe - Call for any changes  Melanocytic Nevi - Tan-brown and/or pink-flesh-colored symmetric macules and papules - Benign appearing on exam today - Observation - Call clinic for new or changing moles - Recommend daily use of broad spectrum spf 30+ sunscreen to sun-exposed areas.   Hemangiomas - Red papules - Discussed benign nature - Observe - Call for any changes  Actinic Damage - Chronic condition, secondary to cumulative UV/sun exposure - diffuse scaly erythematous macules with underlying dyspigmentation - Recommend daily broad spectrum sunscreen SPF 30+ to sun-exposed areas, reapply every 2 hours as needed.  - Staying in the shade or wearing long sleeves, sun glasses (UVA+UVB protection) and wide brim hats (4-inch brim around the entire circumference of the hat) are also recommended for sun protection.  - Call  for new or changing lesions.  Skin cancer screening performed today.  Return in about 6 months (around 04/19/2022) for AK follow up, ISK follow up L forearm, SK L lower eyelid.  Graciella Belton, RMA, am acting as scribe for Brendolyn Patty, MD .  Documentation: I have reviewed the above documentation for accuracy and completeness, and I agree with the above.  Brendolyn Patty MD

## 2021-10-20 DIAGNOSIS — H2511 Age-related nuclear cataract, right eye: Secondary | ICD-10-CM | POA: Diagnosis not present

## 2021-11-16 DIAGNOSIS — M1711 Unilateral primary osteoarthritis, right knee: Secondary | ICD-10-CM | POA: Diagnosis not present

## 2021-11-30 DIAGNOSIS — G4733 Obstructive sleep apnea (adult) (pediatric): Secondary | ICD-10-CM | POA: Diagnosis not present

## 2021-11-30 DIAGNOSIS — R7303 Prediabetes: Secondary | ICD-10-CM | POA: Diagnosis not present

## 2021-11-30 DIAGNOSIS — E039 Hypothyroidism, unspecified: Secondary | ICD-10-CM | POA: Diagnosis not present

## 2022-03-30 DIAGNOSIS — H2511 Age-related nuclear cataract, right eye: Secondary | ICD-10-CM | POA: Diagnosis not present

## 2022-04-05 ENCOUNTER — Ambulatory Visit: Payer: PPO | Admitting: Dermatology

## 2022-04-05 DIAGNOSIS — L570 Actinic keratosis: Secondary | ICD-10-CM

## 2022-04-05 DIAGNOSIS — Z803 Family history of malignant neoplasm of breast: Secondary | ICD-10-CM

## 2022-04-05 DIAGNOSIS — L578 Other skin changes due to chronic exposure to nonionizing radiation: Secondary | ICD-10-CM | POA: Diagnosis not present

## 2022-04-05 DIAGNOSIS — L821 Other seborrheic keratosis: Secondary | ICD-10-CM | POA: Diagnosis not present

## 2022-04-05 NOTE — Patient Instructions (Addendum)
Photodynamic Therapy/Red Light Therapy  Actinic keratoses are the dry, red scaly spots on the skin caused by sun damage. A portion of these spots can turn into skin cancer with time, and treating them can help prevent development of skin cancer.   Treatment of these spots requires removal of the defective skin cells. There are various ways to remove actinic keratoses, including freezing with liquid nitrogen, treatment with creams, or treatment with a Red light procedure in the office.   Photodynamic Therapy (PDT), also known as "Red light therapy" is an in office procedure used to treat actinic keratoses. It works by targeting precancerous cells. After treatment, these cells peel off and are replaced by healthy ones.   For your phototherapy appointment, you will have two appointments on the day of your treatment. The first appointment will be to apply a cream to the treatment area. You will leave this cream on for 1-2 hours depending on the area being treated. The second appointment will be to shine a Red light on the area for 16 minutes to kill off the precancer cells. It is common to experience a burning sensation during the treatment.  After your treatment, it will be important to keep the treated areas of skin out of the sun completely for 48-72 hours (2-3 days) to prevent having a reaction.   Common side effects include: - Burning or stinging, which may be severe and can last up to 24-72 hours after your treatment - Scaling and crusting which may last up to 2 weeks - Redness, swelling and/or peeling which can last up to 4 weeks  To Care for Your Skin After PDT/Red Light Therapy: - Wash with soap, water and shampoo as normal. - If needed, you can use cold compresses (e.g. ice packs) for comfort - If okay with your primary care doctor, you may use analgesics such as acetaminophen (tylenol) every 4-6 hours, not to exceed recommended dose - You may apply Cerave Healing Ointment, Vaseline or  Aquaphor as needed - If you have a lot of swelling you may take a Benadryl to help with this (this may cause drowsiness), not to exceed recommended dose. This may increase the risk of falls in people over 65 and may slow reaction time while driving, so it is not recommended to take before driving or operating machinery. - Sun Precautions - Wear a wide brim hat for the next week if outside  - Wear a sunblock with zinc or titanium dioxide at least SPF 50 daily  If you have any questions or concerns, please call the office and ask to speak with a nurse.   --------------------------------------------------------------------------------------------------------------  Cryotherapy Aftercare  Wash gently with soap and water everyday.   Apply Vaseline and Band-Aid daily until healed.   Actinic keratoses are precancerous spots that appear secondary to cumulative UV radiation exposure/sun exposure over time. They are chronic with expected duration over 1 year. A portion of actinic keratoses will progress to squamous cell carcinoma of the skin. It is not possible to reliably predict which spots will progress to skin cancer and so treatment is recommended to prevent development of skin cancer.  Recommend daily broad spectrum sunscreen SPF 30+ to sun-exposed areas, reapply every 2 hours as needed.  Recommend staying in the shade or wearing long sleeves, sun glasses (UVA+UVB protection) and wide brim hats (4-inch brim around the entire circumference of the hat). Call for new or changing lesions.    Melanoma ABCDEs  Melanoma is the most dangerous type  of skin cancer, and is the leading cause of death from skin disease.  You are more likely to develop melanoma if you: Have light-colored skin, light-colored eyes, or red or blond hair Spend a lot of time in the sun Tan regularly, either outdoors or in a tanning bed Have had blistering sunburns, especially during childhood Have a close family member who has  had a melanoma Have atypical moles or large birthmarks  Early detection of melanoma is key since treatment is typically straightforward and cure rates are extremely high if we catch it early.   The first sign of melanoma is often a change in a mole or a new dark spot.  The ABCDE system is a way of remembering the signs of melanoma.  A for asymmetry:  The two halves do not match. B for border:  The edges of the growth are irregular. C for color:  A mixture of colors are present instead of an even brown color. D for diameter:  Melanomas are usually (but not always) greater than 28m - the size of a pencil eraser. E for evolution:  The spot keeps changing in size, shape, and color.  Please check your skin once per month between visits. You can use a small mirror in front and a large mirror behind you to keep an eye on the back side or your body.   If you see any new or changing lesions before your next follow-up, please call to schedule a visit.  Please continue daily skin protection including broad spectrum sunscreen SPF 30+ to sun-exposed areas, reapplying every 2 hours as needed when you're outdoors.   Staying in the shade or wearing long sleeves, sun glasses (UVA+UVB protection) and wide brim hats (4-inch brim around the entire circumference of the hat) are also recommended for sun protection.    Due to recent changes in healthcare laws, you may see results of your pathology and/or laboratory studies on MyChart before the doctors have had a chance to review them. We understand that in some cases there may be results that are confusing or concerning to you. Please understand that not all results are received at the same time and often the doctors may need to interpret multiple results in order to provide you with the best plan of care or course of treatment. Therefore, we ask that you please give u[place]2 business days to thoroughly review all your results before contacting the office for  clarification. Should we see a critical lab result, you will be contacted sooner.   If You Need Anything After Your Visit  If you have any questions or concerns for your doctor, please call our main line at [phone] press option 4 to reach your doctor's medical assistant. If no one answers, please leave a voicemail as directed and we will return your call as soon as possible. Messages left after 4 pm will be answered the following business day.   You may also send [place] message via [place] We typically respond to MyChart messages within 1-2 business days.  For prescription refills, please ask your pharmacy to contact our office. Our fax number is [phone]  If you have an urgent issue when the clinic is closed that cannot wait until the next business day, you can page your doctor at the number below.    Please note that while we do our best to be available for urgent issues outside of office hours, we are not available 24/7.   If you  have an urgent issue and are unable to reach [place], you may choose to seek medical care at your doctor's office, retail clinic, urgent care center, or emergency room.  If you have a medical emergency, please immediately call 911 or go to the emergency department.  Pager Numbers  - Dr. Nehemiah Massed: [phone]  - Dr. Laurence Ferrari: [phone]  - Dr. Nicole Kindred: [phone]  In the event of inclement weather, please call our main line at [phone] for an update on the status of any delays or closures.  Dermatology Medication Tips: Please keep the boxes that topical medications come in in order to help keep track of the instructions about where and how to use these. Pharmacies typically print the medication instructions only on the boxes and not directly on the medication tubes.   If your medication is too expensive, please contact our office at [phone] option 4 or send [place] a message through [place].   We are unable to tell what your co-pay for  medications will be in advance as this is different depending on your insurance coverage. However, we may be able to find a substitute medication at lower cost or fill out paperwork to get insurance to cover a needed medication.   If a prior authorization is required to get your medication covered by your insurance company, please allow [place] 1-2 business days to complete this process.  Drug prices often vary depending on where the prescription is filled and some pharmacies may offer cheaper prices.  The website www.goodrx.com contains coupons for medications through different pharmacies. The prices here do not account for what the cost may be with help from insurance (it may be cheaper with your insurance), but the website can give you the price if you did not use any insurance.  - You can print the associated coupon and take it with your prescription to the pharmacy.  - You may also stop by our office during regular business hours and pick up a GoodRx coupon card.  - If you need your prescription sent electronically to a different pharmacy, notify our office through [facility] or by phone at [phone] option 4.     Si Usted Necesita Algo Despus de Su Visita  Tambin puede enviarnos un mensaje a travs de Pharmacist, community. Por lo general respondemos a los mensajes de MyChart en el transcurso de 1 a 2 das hbiles.  Para renovar recetas, por favor pida a su farmacia que se ponga en contacto con nuestra oficina. Harland Dingwall de fax es [place] [phone].  Si tiene un asunto urgente cuando la clnica est cerrada y que no puede esperar hasta el siguiente da hbil, puede llamar/localizar a su doctor(a) al nmero que aparece a continuacin.   Por favor, tenga en cuenta que aunque hacemos todo lo posible para estar disponibles para asuntos urgentes fuera del horario de [place], no estamos disponibles las 24 horas del da, los 7 das de la [place].   Si tiene un problema urgente y no puede  comunicarse con nosotros, puede optar por buscar atencin mdica  en el consultorio de su doctor(a), en una clnica privada, en un centro de atencin urgente o en una sala de emergencias.  Si tiene Engineering geologist, por favor llame inmediatamente al 911 o vaya a la sala de emergencias.  Nmeros de bper  - Dr. Nehemiah Massed: [phone]  - Dra. Moye: [phone]  - Dra. Nicole Kindred: [phone]  En caso de inclemencias del tiempo, por favor llame a nuestra lnea principal al [phone]  para [employer] de cualquier retraso o cierre.  Consejos para la medicacin en dermatologa: Por favor, guarde las cajas en las que vienen los medicamentos de uso tpico para ayudarle a seguir las instrucciones sobre dnde y cmo usarlos. Las farmacias generalmente imprimen las instrucciones del medicamento slo en las cajas y no directamente en los tubos del [place].   Si su medicamento es muy caro, por favor, pngase en contacto con Zigmund Daniel llamando al [phone] y presione la opcin 4 o envenos un mensaje a travs de Pharmacist, community.   No podemos decirle cul ser su copago por los medicamentos por adelantado ya que esto es diferente dependiendo de la cobertura de su seguro. Sin embargo, es posible que podamos encontrar un medicamento sustituto a Electrical engineer un formulario para que el seguro cubra el medicamento que se considera necesario.   Si se requiere una autorizacin previa para que su compaa de seguros [place] su medicamento, por favor permtanos de 1 a 2 das hbiles para completar este proceso.  Los precios de los medicamentos varan con frecuencia dependiendo del Environmental consultant de dnde se surte la receta y alguna farmacias pueden ofrecer precios ms baratos.  El sitio web www.goodrx.com tiene cupones para medicamentos de Airline pilot. Los precios aqu no tienen en cuenta lo que podra costar con la ayuda del seguro (puede ser ms barato con su seguro), pero  el sitio web puede darle el precio si no utiliz Research scientist (physical sciences).  - Puede imprimir el cupn correspondiente y llevarlo con su receta a la farmacia.  - Tambin puede pasar por nuestra oficina durante el horario de atencin regular y Charity fundraiser una tarjeta de cupones de GoodRx.  - Si necesita que su receta se enve electrnicamente a una farmacia diferente, informe a nuestra oficina a travs de MyChart de [place] o por telfono llamando al [phone] y presione la opcin 4.

## 2022-04-05 NOTE — Progress Notes (Signed)
Follow-Up Visit   Subjective  Bridget Howe is a 76 y.o. female who presents for the following: Actinic Keratosis (6 month followup) and Seborrheic Keratosis (6 month followup).  She has h/o SCC.  She has a family h/o melanoma.  Pt reports itchy spot that she scratched off at right lower leg. Appears clear today.     The patient has spots, moles and lesions to be evaluated, some may be new or changing and the patient has concerns that these could be cancer.   The following portions of the chart were reviewed this encounter and updated as appropriate:      Review of Systems: No other skin or systemic complaints except as noted in HPI or Assessment and Plan.   Objective  Well appearing patient in no apparent distress; mood and affect are within normal limits.  A focused examination was performed including face and right lower leg. Relevant physical exam findings are noted in the Assessment and Plan.  right upper lip x 1, left upper lip x 2 , left nasal dorsum x  2 (5) Erythematous thin papules/macules with gritty scale.    Assessment & Plan  Actinic keratosis (5) right upper lip x 1, left upper lip x 2 , left nasal dorsum x  2  Discussed field treatment, including 5 f/u cream treatment, photodynamic therapy (red/blue light treatment)  Patient prefers red light treatment- may schedule in January  Actinic keratoses are precancerous spots that appear secondary to cumulative UV radiation exposure/sun exposure over time. They are chronic with expected duration over 1 year. A portion of actinic keratoses will progress to squamous cell carcinoma of the skin. It is not possible to reliably predict which spots will progress to skin cancer and so treatment is recommended to prevent development of skin cancer.  Recommend daily broad spectrum sunscreen SPF 30+ to sun-exposed areas, reapply every 2 hours as needed.  Recommend staying in the shade or wearing long sleeves, sun glasses  (UVA+UVB protection) and wide brim hats (4-inch brim around the entire circumference of the hat). Call for new or changing lesions.  Destruction of lesion - right upper lip x 1, left upper lip x 2 , left nasal dorsum x  2  Destruction method: cryotherapy   Informed consent: discussed and consent obtained   Lesion destroyed using liquid nitrogen: Yes   Region frozen until ice ball extended beyond lesion: Yes   Outcome: patient tolerated procedure well with no complications   Post-procedure details: wound care instructions given   Additional details:  Prior to procedure, discussed risks of blister formation, small wound, skin dyspigmentation, or rare scar following cryotherapy. Recommend Vaseline ointment to treated areas while healing.    Seborrheic Keratoses At left flank - Stuck-on, waxy, tan-brown papules and/or plaques  - Benign-appearing - Discussed benign etiology and prognosis. - Observe - Call for any changes  Actinic Damage with PreCancerous Actinic Keratoses Counseling for Topical Chemotherapy Management: Patient exhibits: - Severe, confluent actinic changes with pre-cancerous actinic keratoses that is secondary to cumulative UV radiation exposure over time - Condition that is severe; chronic, not at goal. - diffuse scaly erythematous macules and papules with underlying dyspigmentation - Discussed Prescription "Field Treatment" topical Chemotherapy for Severe, Chronic Confluent Actinic Changes with Pre-Cancerous Actinic Keratoses Field treatment involves treatment of an entire area of skin that has confluent Actinic Changes (Sun/ Ultraviolet light damage) and PreCancerous Actinic Keratoses by method of PhotoDynamic Therapy (PDT) and/or prescription Topical Chemotherapy agents such as 5-fluorouracil, 5-fluorouracil/calcipotriene,  and/or imiquimod.  The purpose is to decrease the number of clinically evident and subclinical PreCancerous lesions to prevent progression to  development of skin cancer by chemically destroying early precancer changes that may or may not be visible.  It has been shown to reduce the risk of developing skin cancer in the treated area. As a result of treatment, redness, scaling, crusting, and open sores may occur during treatment course. One or more than one of these methods may be used and may have to be used several times to control, suppress and eliminate the PreCancerous changes. Discussed treatment course, expected reaction, and possible side effects. - Recommend daily broad spectrum sunscreen SPF 30+ to sun-exposed areas, reapply every 2 hours as needed.  - Staying in the shade or wearing long sleeves, sun glasses (UVA+UVB protection) and wide brim hats (4-inch brim around the entire circumference of the hat) are also recommended. - Call for new or changing lesions.  - Patient will call to schedule Red light photodynamic therapy to the face with debridement. Information given to patient at visit.    Family history of skin cancer - what type(s): melanoma - who affected: father, sister and daughter -pt may want to consider genetic counseling at [facility] since family h/o multiple melanomas and breast cancer, info given Sister has history of breast cancer  Return in about 6 months (around 10/05/2022) for tbse . I, Ruthell Rummage, CMA, am acting as scribe for Brendolyn Patty, MD.  Documentation: I have reviewed the above documentation for accuracy and completeness, and I agree with the above.  Brendolyn Patty MD

## 2022-04-20 DIAGNOSIS — H2512 Age-related nuclear cataract, left eye: Secondary | ICD-10-CM | POA: Diagnosis not present

## 2022-04-27 ENCOUNTER — Encounter: Payer: Self-pay | Admitting: Ophthalmology

## 2022-05-09 NOTE — Discharge Instructions (Signed)

## 2022-05-10 ENCOUNTER — Encounter: Payer: Self-pay | Admitting: Ophthalmology

## 2022-05-10 ENCOUNTER — Other Ambulatory Visit: Payer: Self-pay

## 2022-05-10 ENCOUNTER — Encounter
Admission: RE | Disposition: A | Discharge status: Home / Self Care | Payer: Self-pay | Source: Home / Self Care | Attending: Ophthalmology

## 2022-05-10 ENCOUNTER — Ambulatory Visit
Admission: RE | Admit: 2022-05-10 | Discharge: 2022-05-10 | Disposition: A | Discharge status: Home / Self Care | Payer: PPO | Attending: Ophthalmology | Admitting: Ophthalmology

## 2022-05-10 ENCOUNTER — Ambulatory Visit: Payer: PPO | Admitting: General Practice

## 2022-05-10 DIAGNOSIS — H2512 Age-related nuclear cataract, left eye: Secondary | ICD-10-CM | POA: Insufficient documentation

## 2022-05-10 DIAGNOSIS — H25042 Posterior subcapsular polar age-related cataract, left eye: Secondary | ICD-10-CM | POA: Diagnosis not present

## 2022-05-10 DIAGNOSIS — Z8249 Family history of ischemic heart disease and other diseases of the circulatory system: Secondary | ICD-10-CM | POA: Diagnosis not present

## 2022-05-10 DIAGNOSIS — G4730 Sleep apnea, unspecified: Secondary | ICD-10-CM | POA: Insufficient documentation

## 2022-05-10 DIAGNOSIS — Z833 Family history of diabetes mellitus: Secondary | ICD-10-CM | POA: Diagnosis not present

## 2022-05-10 DIAGNOSIS — E119 Type 2 diabetes mellitus without complications: Secondary | ICD-10-CM | POA: Insufficient documentation

## 2022-05-10 DIAGNOSIS — Z87891 Personal history of nicotine dependence: Secondary | ICD-10-CM | POA: Diagnosis not present

## 2022-05-10 DIAGNOSIS — E039 Hypothyroidism, unspecified: Secondary | ICD-10-CM | POA: Insufficient documentation

## 2022-05-10 HISTORY — DX: Unspecified osteoarthritis, unspecified site: M19.90

## 2022-05-10 HISTORY — PX: CATARACT EXTRACTION W/PHACO: SHX586

## 2022-05-10 HISTORY — DX: Other complications of anesthesia, initial encounter: T88.59XA

## 2022-05-10 SURGERY — PHACOEMULSIFICATION, CATARACT, WITH IOL INSERTION
Anesthesia: Monitor Anesthesia Care | Site: Eye | Laterality: Left

## 2022-05-10 MED ORDER — FENTANYL CITRATE PF 50 MCG/ML IJ SOSY: 25.0000 ug | PREFILLED_SYRINGE | INTRAMUSCULAR | Status: DC | PRN

## 2022-05-10 MED ORDER — BRIMONIDINE TARTRATE-TIMOLOL 0.2-0.5 % OP SOLN
OPHTHALMIC | Status: DC | PRN
  Administered 2022-05-10: 1 [drp] via OPHTHALMIC

## 2022-05-10 MED ORDER — TETRACAINE HCL 0.5 % OP SOLN
1.0000 [drp] | OPHTHALMIC | Status: DC | PRN
  Administered 2022-05-10 (×3): 1 [drp] via OPHTHALMIC

## 2022-05-10 MED ORDER — SIGHTPATH DOSE#1 NA CHONDROIT SULF-NA HYALURON 40-17 MG/ML IO SOLN
INTRAOCULAR | Status: DC | PRN
  Administered 2022-05-10: 1 mL via INTRAOCULAR

## 2022-05-10 MED ORDER — LACTATED RINGERS IV SOLN: INTRAVENOUS | Status: DC

## 2022-05-10 MED ORDER — SIGHTPATH DOSE#1 BSS IO SOLN
INTRAOCULAR | Status: DC | PRN
  Administered 2022-05-10: 72 mL via OPHTHALMIC

## 2022-05-10 MED ORDER — MIDAZOLAM HCL 2 MG/2ML IJ SOLN
INTRAMUSCULAR | Status: DC | PRN
  Administered 2022-05-10: 1 mg via INTRAVENOUS

## 2022-05-10 MED ORDER — SIGHTPATH DOSE#1 BSS IO SOLN
INTRAOCULAR | Status: DC | PRN
  Administered 2022-05-10: 15 mL via INTRAOCULAR

## 2022-05-10 MED ORDER — ONDANSETRON HCL 4 MG/2ML IJ SOLN: 4.0000 mg | Freq: Once | INTRAMUSCULAR | Status: DC | PRN

## 2022-05-10 MED ORDER — SIGHTPATH DOSE#1 BSS IO SOLN
INTRAOCULAR | Status: DC | PRN
  Administered 2022-05-10: 2 mL

## 2022-05-10 MED ORDER — FENTANYL CITRATE (PF) 100 MCG/2ML IJ SOLN
INTRAMUSCULAR | Status: DC | PRN
  Administered 2022-05-10: 50 ug via INTRAVENOUS

## 2022-05-10 MED ORDER — MOXIFLOXACIN HCL 0.5 % OP SOLN
OPHTHALMIC | Status: DC | PRN
  Administered 2022-05-10: .2 mL via OPHTHALMIC

## 2022-05-10 MED ORDER — ARMC OPHTHALMIC DILATING DROPS
1.0000 | OPHTHALMIC | Status: DC | PRN
  Administered 2022-05-10 (×3): 1 via OPHTHALMIC

## 2022-05-10 SURGICAL SUPPLY — 10 items
CATARACT SUITE SIGHTPATH (MISCELLANEOUS) ×1 IMPLANT
FEE CATARACT SUITE SIGHTPATH (MISCELLANEOUS) ×1 IMPLANT
GLOVE SURG ENC TEXT LTX SZ8 (GLOVE) ×1 IMPLANT
GLOVE SURG TRIUMPH 8.0 PF LTX (GLOVE) ×1 IMPLANT
LENS CLAREON VIVITY IOL 22.5 ×1 IMPLANT
LENS IOL CLRN VT YLW 22.5 IMPLANT
NDL FILTER BLUNT 18X1 1/2 (NEEDLE) ×1 IMPLANT
NEEDLE FILTER BLUNT 18X1 1/2 (NEEDLE) ×1 IMPLANT
SYR 3ML LL SCALE MARK (SYRINGE) ×1 IMPLANT
WATER STERILE IRR 250ML POUR (IV SOLUTION) ×1 IMPLANT

## 2022-05-10 NOTE — Op Note (Signed)
PREOPERATIVE DIAGNOSIS:  Nuclear sclerotic cataract of the left eye.   POSTOPERATIVE DIAGNOSIS:  Nuclear sclerotic cataract of the left eye.   OPERATIVE PROCEDURE:ORPROCALL@   SURGEON:  Birder Robson, MD.   ANESTHESIA:  Anesthesiologist: Molli Barrows, MD  1.      Managed anesthesia care. 2.     0.106m of Shugarcaine was instilled following the paracentesis   COMPLICATIONS:  None.   TECHNIQUE:   Stop and chop   DESCRIPTION OF PROCEDURE:  The patient was examined and consented in the preoperative holding area where the aforementioned topical anesthesia was applied to the left eye and then brought back to the Operating Room where the left eye was prepped and draped in the usual sterile ophthalmic fashion and a lid speculum was placed. A paracentesis was created with the side port blade and the anterior chamber was filled with viscoelastic. A near clear corneal incision was performed with the steel keratome. A continuous curvilinear capsulorrhexis was performed with a cystotome followed by the capsulorrhexis forceps. Hydrodissection and hydrodelineation were carried out with BSS on a blunt cannula. The lens was removed in a stop and chop  technique and the remaining cortical material was removed with the irrigation-aspiration handpiece. The capsular bag was inflated with viscoelastic and the Technis ZCB00 lens was placed in the capsular bag without complication. The remaining viscoelastic was removed from the eye with the irrigation-aspiration handpiece. The wounds were hydrated. The anterior chamber was flushed with BSS and the eye was inflated to physiologic pressure. 0.141mVigamox was placed in the anterior chamber. The wounds were found to be water tight. The eye was dressed with Combigan. The patient was given protective glasses to wear throughout the day and a shield with which to sleep tonight. The patient was also given drops with which to begin a drop regimen today and will follow-up with  me in one day. Implant Name Type Inv. Item Serial No. Manufacturer Lot No. LRB No. Used Action  LENS CLAREON VIVITY IOL 22.5 - [id number] CLAREON VIVITY IOL 22.5 [id number]  Left 1 Implanted    Procedure(s): CATARACT EXTRACTION PHACO AND INTRAOCULAR LENS PLACEMENT (IOC) LEFT  CLAREON VIVITY LENS  7.00  00:38.9 (Left)  Electronically signed: WiBirder Robson/01/2023 1:35 PM

## 2022-05-10 NOTE — Anesthesia Preprocedure Evaluation (Signed)
Anesthesia Evaluation  Patient identified by MRN, date of birth, ID band Patient awake    Reviewed: Allergy & Precautions, H&P , NPO status , Patient's Chart, lab work & pertinent test results, reviewed documented beta blocker date and time   History of Anesthesia Complications (+) history of anesthetic complications  Airway Mallampati: II  TM Distance: >3 FB Neck ROM: full    Dental no notable dental hx. (+) Teeth Intact   Pulmonary sleep apnea , former smoker   Pulmonary exam normal breath sounds clear to auscultation       Cardiovascular Exercise Tolerance: Good hypertension, On Medications + dysrhythmias  Rhythm:regular Rate:Normal     Neuro/Psych  Neuromuscular disease  negative psych ROS   GI/Hepatic negative GI ROS, Neg liver ROS,,,  Endo/Other  diabetes, Well ControlledHypothyroidism    Renal/GU      Musculoskeletal   Abdominal   Peds  Hematology negative hematology ROS (+)   Anesthesia Other Findings   Reproductive/Obstetrics negative OB ROS                             Anesthesia Physical Anesthesia Plan  ASA: 3  Anesthesia Plan: MAC   Post-op Pain Management:    Induction:   PONV Risk Score and Plan:   Airway Management Planned:   Additional Equipment:   Intra-op Plan:   Post-operative Plan:   Informed Consent: I have reviewed the patients History and Physical, chart, labs and discussed the procedure including the risks, benefits and alternatives for the proposed anesthesia with the patient or authorized representative who has indicated his/her understanding and acceptance.       Plan Discussed with: CRNA  Anesthesia Plan Comments:        Anesthesia Quick Evaluation

## 2022-05-10 NOTE — Transfer of Care (Signed)
Immediate Anesthesia Transfer of Care Note  Patient: Bridget Howe  Procedure(s) Performed: CATARACT EXTRACTION PHACO AND INTRAOCULAR LENS PLACEMENT (IOC) LEFT  CLAREON VIVITY LENS  7.00  00:38.9 (Left: Eye)  Patient Location:   Anesthesia Type:  Level of Consciousness:   Airway & Oxygen Therapy:   Post-op Assessment:   Post vital signs:   Last Vitals:  Vitals Value Taken Time  BP 146/84 05/10/22 1342  Temp 36.2 C 05/10/22 1342  Pulse 57 05/10/22 1342  Resp 16 05/10/22 1342  SpO2 96 % 05/10/22 1342  Vitals shown include unvalidated device data.  Last Pain:  Vitals:   05/10/22 1342  TempSrc:   PainSc: 0-No pain         Complications: No notable events documented.

## 2022-05-10 NOTE — H&P (Signed)
[facility]   Primary Care Physician:  Kirk Ruths, MD Ophthalmologist: Dr. George Ina  Pre-Procedure History & Physical: HPI:  Bridget Howe is a 77 y.o. female here for cataract surgery.   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   Right dorsal hand   Squamous cell carcinoma of skin    L medial canthus, treated around 1992    Past Surgical History:  Procedure Laterality Date   BREAST CYST ASPIRATION Bilateral    3 on right, 2 on the left   CARDIAC CATHETERIZATION     CARPAL TUNNEL RELEASE Bilateral    COLONOSCOPY WITH PROPOFOL N/A 03/09/2016   Procedure: COLONOSCOPY WITH PROPOFOL;  Surgeon: Manya Silvas, MD;  Location: [place];  Service: Endoscopy;  Laterality: N/A;   ESOPHAGOGASTRODUODENOSCOPY (EGD) WITH PROPOFOL N/A 03/09/2016   Procedure: ESOPHAGOGASTRODUODENOSCOPY (EGD) WITH PROPOFOL;  Surgeon: Manya Silvas, MD;  Location: [facility] ENDOSCOPY;  Service: Endoscopy;  Laterality: N/A;   KNEE ARTHROSCOPY Right    NECK SURGERY     TONSILLECTOMY      Prior to Admission medications   Medication Sig Start Date End Date Taking? Authorizing Provider  acetaminophen (TYLENOL) 500 MG tablet Take 500 mg by mouth every 4 (four) hours as needed.   Yes [provider]  b complex vitamins capsule Take 1 capsule by mouth daily.   Yes [provider]  cholecalciferol (VITAMIN D3) 25 MCG (1000 UNIT) tablet Take 1,000 Units by mouth daily.   Yes [provider]  diclofenac sodium (VOLTAREN) 1 % GEL Apply 4 g topically 4 (four) times daily.   Yes [provider]  diphenhydrAMINE (BENADRYL) 25 MG tablet Take 25 mg by mouth at bedtime as needed. Take 1/2 tablet 12.5 mg   Yes [provider]  fluticasone (FLONASE) 50 MCG/ACT nasal spray Place into both nostrils daily.   Yes [provider]  levothyroxine (SYNTHROID, LEVOTHROID) 50 MCG tablet Take 50 mcg by mouth daily before breakfast.   Yes [provider]  Misc Natural Products (PYCNOGENOL PLUS PO) Take by mouth.   Yes [provider]  aspirin EC 81 MG tablet Take 81 mg by mouth daily. Patient not taking: Reported on 04/27/2022    [provider]    Allergies as of 04/04/2022 - Review Complete 10/05/2020  Allergen Reaction Noted   Influenza vaccines Anaphylaxis 05/28/2019   Caffeine  03/09/2016   Copper-containing compounds  03/09/2016   Eggs or egg-derived products Swelling 03/09/2016   Nexium [esomeprazole]  03/09/2016   Nickel  03/09/2016   Pseudoephedrine hcl  03/09/2016   Ranitidine hcl  03/09/2016   Tacrolimus  02/26/2020    Family History  Problem Relation Age of Onset   Breast cancer Sister 39   Skin cancer Sister    Diabetes Mellitus II Sister    Hypertension Sister    Melanoma Sister    Hypertension Mother    Heart failure Mother    Skin cancer Father    Melanoma Father    Autoimmune disease Daughter     Social History   Socioeconomic History   Marital status: Married    Spouse name: Not on file  Number of children: Not on file   Years of education: Not on file   Highest education level: Not on file  Occupational History   Not on file  Tobacco Use   Smoking status: Former   Smokeless tobacco: Never  Substance and Sexual Activity   Alcohol use: No   Drug use: No   Sexual activity: Not Currently    Birth control/protection: Post-menopausal  Other Topics Concern   Not on file  Social History Narrative   Not on file   Social Determinants of Health   Financial Resource Strain: Not on file  Food Insecurity: Not on file  Transportation Needs: Not on file  Physical Activity: Not on file  Stress: Not on file  Social Connections: Not on  file  Intimate Partner Violence: Not on file    Review of Systems: See HPI, otherwise negative ROS  Physical Exam: BP (!) 162/76   Temp (!) 97.2 F (36.2 C) (Tympanic)   Ht '5\' 2"'$  (1.575 m)   Wt 71.7 kg   SpO2 97%   BMI 28.92 kg/m  General:   Alert, cooperative in NAD Head:  Normocephalic and atraumatic. Respiratory:  Normal work of breathing. Cardiovascular:  RRR  Impression/Plan: Bridget Howe is here for cataract surgery.  Risks, benefits, limitations, and alternatives regarding cataract surgery have been reviewed with the patient.  Questions have been answered.  All parties agreeable.   Birder Robson, MD  05/10/2022, 1:09 PM

## 2022-05-11 ENCOUNTER — Encounter: Payer: Self-pay | Admitting: Ophthalmology

## 2022-05-11 NOTE — Anesthesia Postprocedure Evaluation (Signed)
Anesthesia Post Note  Patient: Bridget Howe  Procedure(s) Performed: CATARACT EXTRACTION PHACO AND INTRAOCULAR LENS PLACEMENT (IOC) LEFT  CLAREON VIVITY LENS  7.00  00:38.9 (Left: Eye)  Patient location during evaluation: PACU Anesthesia Type: MAC Level of consciousness: awake and alert Pain management: pain level controlled Vital Signs Assessment: post-procedure vital signs reviewed and stable Respiratory status: spontaneous breathing, nonlabored ventilation, respiratory function stable and patient connected to nasal cannula oxygen Cardiovascular status: blood pressure returned to baseline and stable Postop Assessment: no apparent nausea or vomiting Anesthetic complications: no   No notable events documented.   Last Vitals:  Vitals:   05/10/22 1336 05/10/22 1342  BP: (!) 144/89 (!) 146/84  Pulse: 64 (!) 55  Resp: 14 18  Temp: (!) 36.2 C (!) 36.2 C  SpO2: 99% 97%    Last Pain:  Vitals:   05/10/22 1342  TempSrc:   PainSc: 0-No pain                 Molli Barrows

## 2022-05-18 DIAGNOSIS — H2511 Age-related nuclear cataract, right eye: Secondary | ICD-10-CM | POA: Diagnosis not present

## 2022-05-23 NOTE — Discharge Instructions (Signed)
   Cataract Surgery, Care After ? ?This sheet gives you information about how to care for yourself after your surgery.  Your ophthalmologist may also give you more specific instructions.  If you have problems or questions, contact your doctor at [facility], [phone]. ? ?What can I expect after the surgery? ?It is common to have: ?Itching ?Foreign body sensation (feels like a grain of sand in the eye) ?Watery discharge (excess tearing) ?Sensitivity to light and touch ?Bruising in or around the eye ?Mild blurred vision ? ?Follow these instructions at home: ?Do not touch or rub your eyes. ?You may be told to wear a protective shield or sunglasses to protect your eyes. ?Do not put a contact lens in the operative eye unless your doctor approves. ?Keep the lids and face clean and dry. ?Do not allow water to hit you directly in the face while showering. ?Keep soap and shampoo out of your eyes. ?Do not use eye makeup for 1 week. ? ?Check your eye every day for signs of infection.  Watch for: ?Redness, swelling, or pain. ?Fluid, blood or pus. ?Worsening vision. ?Worsening sensitivity to light or touch. ? ?Activity: ?During the first day, avoid bending over and reading.  You may resume reading and bending the next day. ?Do not drive or use heavy machinery for at least 24 hours. ?Avoid strenuous activities for 1 week.  Activities such as walking, treadmill, exercise bike, and climbing stairs are okay. ?Do not lift heavy (>20 pound) objects for 1 week. ?Do not do yardwork, gardening, or dirty housework (mopping, cleaning bathrooms, vacuuming, etc.) for 1 week. ?Do not swim or use a hot tub for 2 weeks. ?Ask your doctor when you can return to work. ? ?General Instructions: ?Take or apply prescription and over-the-counter medicines as directed by your doctor, including eyedrops and ointments. ?Resume medications discontinued prior to surgery, unless told otherwise by your doctor. ?Keep all follow up appointments as  scheduled. ? ?Contact a health care provider if: ?You have increased bruising around your eye. ?You have pain that is not helped with medication. ?You have a fever. ?You have fluid, pus, or blood coming from your eye or incision. ?Your sensitivity to light gets worse. ?You have spots (floaters) of flashing lights in your vision. ?You have nausea or vomiting. ? ?Go to the nearest emergency room or call 911 if: ?You have sudden loss of vision. ?You have severe, worsening eye pain. ? ?

## 2022-05-24 ENCOUNTER — Encounter
Admission: RE | Disposition: A | Discharge status: Home / Self Care | Payer: Self-pay | Source: Home / Self Care | Attending: Ophthalmology

## 2022-05-24 ENCOUNTER — Encounter: Payer: Self-pay | Admitting: Ophthalmology

## 2022-05-24 ENCOUNTER — Other Ambulatory Visit: Payer: Self-pay

## 2022-05-24 ENCOUNTER — Ambulatory Visit: Payer: PPO | Admitting: Anesthesiology

## 2022-05-24 ENCOUNTER — Ambulatory Visit
Admission: RE | Admit: 2022-05-24 | Discharge: 2022-05-24 | Disposition: A | Discharge status: Home / Self Care | Payer: PPO | Attending: Ophthalmology | Admitting: Ophthalmology

## 2022-05-24 DIAGNOSIS — R7303 Prediabetes: Secondary | ICD-10-CM | POA: Diagnosis not present

## 2022-05-24 DIAGNOSIS — Z833 Family history of diabetes mellitus: Secondary | ICD-10-CM | POA: Insufficient documentation

## 2022-05-24 DIAGNOSIS — Z8249 Family history of ischemic heart disease and other diseases of the circulatory system: Secondary | ICD-10-CM | POA: Insufficient documentation

## 2022-05-24 DIAGNOSIS — E039 Hypothyroidism, unspecified: Secondary | ICD-10-CM | POA: Diagnosis not present

## 2022-05-24 DIAGNOSIS — Z87891 Personal history of nicotine dependence: Secondary | ICD-10-CM | POA: Diagnosis not present

## 2022-05-24 DIAGNOSIS — I252 Old myocardial infarction: Secondary | ICD-10-CM | POA: Insufficient documentation

## 2022-05-24 DIAGNOSIS — G4730 Sleep apnea, unspecified: Secondary | ICD-10-CM | POA: Insufficient documentation

## 2022-05-24 DIAGNOSIS — H2511 Age-related nuclear cataract, right eye: Secondary | ICD-10-CM | POA: Insufficient documentation

## 2022-05-24 DIAGNOSIS — T7840XA Allergy, unspecified, initial encounter: Secondary | ICD-10-CM | POA: Diagnosis not present

## 2022-05-24 HISTORY — PX: CATARACT EXTRACTION W/PHACO: SHX586

## 2022-05-24 SURGERY — PHACOEMULSIFICATION, CATARACT, WITH IOL INSERTION
Anesthesia: Monitor Anesthesia Care | Site: Eye | Laterality: Right

## 2022-05-24 MED ORDER — ARMC OPHTHALMIC DILATING DROPS
1.0000 | OPHTHALMIC | Status: DC | PRN
  Administered 2022-05-24 (×3): 1 via OPHTHALMIC

## 2022-05-24 MED ORDER — ONDANSETRON HCL 4 MG/2ML IJ SOLN: 4.0000 mg | Freq: Once | INTRAMUSCULAR | Status: DC | PRN

## 2022-05-24 MED ORDER — TETRACAINE HCL 0.5 % OP SOLN
1.0000 [drp] | OPHTHALMIC | Status: DC | PRN
  Administered 2022-05-24 (×3): 1 [drp] via OPHTHALMIC

## 2022-05-24 MED ORDER — ACETAMINOPHEN 160 MG/5ML PO SOLN: 325.0000 mg | ORAL | Status: DC | PRN

## 2022-05-24 MED ORDER — SIGHTPATH DOSE#1 NA CHONDROIT SULF-NA HYALURON 40-17 MG/ML IO SOLN
INTRAOCULAR | Status: DC | PRN
  Administered 2022-05-24: 1 mL via INTRAOCULAR

## 2022-05-24 MED ORDER — SIGHTPATH DOSE#1 BSS IO SOLN
INTRAOCULAR | Status: DC | PRN
  Administered 2022-05-24: 63 mL via OPHTHALMIC

## 2022-05-24 MED ORDER — MOXIFLOXACIN HCL 0.5 % OP SOLN
OPHTHALMIC | Status: DC | PRN
  Administered 2022-05-24: .2 mL via OPHTHALMIC

## 2022-05-24 MED ORDER — BRIMONIDINE TARTRATE-TIMOLOL 0.2-0.5 % OP SOLN
OPHTHALMIC | Status: DC | PRN
  Administered 2022-05-24: 1 [drp] via OPHTHALMIC

## 2022-05-24 MED ORDER — ACETAMINOPHEN 325 MG PO TABS: 650.0000 mg | ORAL_TABLET | Freq: Once | ORAL | Status: DC | PRN

## 2022-05-24 MED ORDER — SIGHTPATH DOSE#1 BSS IO SOLN
INTRAOCULAR | Status: DC | PRN
  Administered 2022-05-24: 15 mL via INTRAOCULAR

## 2022-05-24 MED ORDER — MIDAZOLAM HCL 2 MG/2ML IJ SOLN
INTRAMUSCULAR | Status: DC | PRN
  Administered 2022-05-24: 1 mg via INTRAVENOUS

## 2022-05-24 MED ORDER — FENTANYL CITRATE (PF) 100 MCG/2ML IJ SOLN
INTRAMUSCULAR | Status: DC | PRN
  Administered 2022-05-24: 50 ug via INTRAVENOUS

## 2022-05-24 MED ORDER — LACTATED RINGERS IV SOLN: INTRAVENOUS | Status: DC

## 2022-05-24 MED ORDER — SIGHTPATH DOSE#1 BSS IO SOLN
INTRAOCULAR | Status: DC | PRN
  Administered 2022-05-24: 2 mL

## 2022-05-24 SURGICAL SUPPLY — 16 items
CANNULA ANT/CHMB 27G (MISCELLANEOUS) IMPLANT
CANNULA ANT/CHMB 27GA (MISCELLANEOUS) IMPLANT
CATARACT SUITE SIGHTPATH (MISCELLANEOUS) ×1 IMPLANT
FEE CATARACT SUITE SIGHTPATH (MISCELLANEOUS) ×1 IMPLANT
GLOVE BIOGEL PI IND STRL 8 (GLOVE) ×1 IMPLANT
GLOVE SURG ENC TEXT LTX SZ8 (GLOVE) ×1 IMPLANT
LENS CLAREON VIVITY IOL 22.5 ×1 IMPLANT
LENS IOL CLRN VT YLW 22.5 IMPLANT
NDL FILTER BLUNT 18X1 1/2 (NEEDLE) ×1 IMPLANT
NEEDLE FILTER BLUNT 18X1 1/2 (NEEDLE) ×1 IMPLANT
PACK VIT ANT 23G (MISCELLANEOUS) IMPLANT
RING MALYGIN (MISCELLANEOUS) IMPLANT
SUT ETHILON 10-0 CS-B-6CS-B-6 (SUTURE)
SUTURE EHLN 10-0 CS-B-6CS-B-6 (SUTURE) IMPLANT
SYR 3ML LL SCALE MARK (SYRINGE) ×1 IMPLANT
WATER STERILE IRR 250ML POUR (IV SOLUTION) ×1 IMPLANT

## 2022-05-24 NOTE — Anesthesia Postprocedure Evaluation (Signed)
Anesthesia Post Note  Patient: Bridget Howe  Procedure(s) Performed: CATARACT EXTRACTION PHACO AND INTRAOCULAR LENS PLACEMENT (IOC) RIGHT CLAREON VIVITY 6.22 00:40.2 (Right: Eye)  Patient location during evaluation: PACU Anesthesia Type: MAC Level of consciousness: awake and alert, oriented and patient cooperative Pain management: pain level controlled Vital Signs Assessment: post-procedure vital signs reviewed and stable Respiratory status: spontaneous breathing, nonlabored ventilation and respiratory function stable Cardiovascular status: blood pressure returned to baseline and stable Postop Assessment: adequate PO intake Anesthetic complications: no   No notable events documented.   Last Vitals:  Vitals:   05/24/22 1112 05/24/22 1117  BP: (!) 151/76 (!) 149/78  Pulse: 60 (!) 56  Resp: 11 11  Temp: (!) 36.1 C (!) 36.1 C  SpO2: 98% 96%    Last Pain:  Vitals:   05/24/22 1117  TempSrc:   PainSc: 0-No pain                 Darrin Nipper

## 2022-05-24 NOTE — Op Note (Signed)
PREOPERATIVE DIAGNOSIS:  Nuclear sclerotic cataract of the right eye.   POSTOPERATIVE DIAGNOSIS:  Cataract   OPERATIVE PROCEDURE:ORPROCALL@   SURGEON:  Birder Robson, MD.   ANESTHESIA:  Anesthesiologist: Darrin Nipper, MD CRNA: Gigi Gin, CRNA  1.      Managed anesthesia care. 2.      0.53m of Shugarcaine was instilled in the eye following the paracentesis.   COMPLICATIONS:  None.   TECHNIQUE:   Stop and chop   DESCRIPTION OF PROCEDURE:  The patient was examined and consented in the preoperative holding area where the aforementioned topical anesthesia was applied to the right eye and then brought back to the Operating Room where the right eye was prepped and draped in the usual sterile ophthalmic fashion and a lid speculum was placed. A paracentesis was created with the side port blade and the anterior chamber was filled with viscoelastic. A near clear corneal incision was performed with the steel keratome. A continuous curvilinear capsulorrhexis was performed with a cystotome followed by the capsulorrhexis forceps. Hydrodissection and hydrodelineation were carried out with BSS on a blunt cannula. The lens was removed in a stop and chop  technique and the remaining cortical material was removed with the irrigation-aspiration handpiece. The capsular bag was inflated with viscoelastic and the Technis [id number]  lens was placed in the capsular bag without complication. The remaining viscoelastic was removed from the eye with the irrigation-aspiration handpiece. The wounds were hydrated. The anterior chamber was flushed with BSS and the eye was inflated to physiologic pressure. 0.170mof Vigamox was placed in the anterior chamber. The wounds were found to be water tight. The eye was dressed with Combigan. The patient was given protective glasses to wear throughout the day and a shield with which to sleep tonight. The patient was also given drops with which to begin a drop regimen today and will  follow-up with me in one day. Implant Name Type Inv. Item Serial No. Manufacturer Lot No. LRB No. Used Action  LENS CLAREON VIVITY IOL 22.5 - [id number] CLAREON VIVITY IOL 22.5 [id number]  Right 1 Implanted   Procedure(s): CATARACT EXTRACTION PHACO AND INTRAOCULAR LENS PLACEMENT (IOC) RIGHT CLAREON VIVITY 6.22 00:40.2 (Right)  Electronically signed: WiBirder Robson/23/2024 11:10 AM

## 2022-05-24 NOTE — Transfer of Care (Signed)
Immediate Anesthesia Transfer of Care Note  Patient: Bridget Howe  Procedure(s) Performed: CATARACT EXTRACTION PHACO AND INTRAOCULAR LENS PLACEMENT (IOC) RIGHT CLAREON VIVITY 6.22 00:40.2 (Right: Eye)  Patient Location: PACU  Anesthesia Type: MAC  Level of Consciousness: awake, alert  and patient cooperative  Airway and Oxygen Therapy: Patient Spontanous Breathing and Patient connected to supplemental oxygen  Post-op Assessment: Post-op Vital signs reviewed, Patient's Cardiovascular Status Stable, Respiratory Function Stable, Patent Airway and No signs of Nausea or vomiting  Post-op Vital Signs: Reviewed and stable  Complications: No notable events documented.

## 2022-05-24 NOTE — H&P (Signed)
[facility]   Primary Care Physician:  Kirk Ruths, MD Ophthalmologist: Dr. George Ina  Pre-Procedure History & Physical: HPI:  Bridget Howe is a 77 y.o. female here for cataract surgery.   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   Right dorsal hand   Squamous cell carcinoma of skin    L medial canthus, treated around 1992    Past Surgical History:  Procedure Laterality Date   BREAST CYST ASPIRATION Bilateral    3 on right, 2 on the left   CARDIAC CATHETERIZATION     CARPAL TUNNEL RELEASE Bilateral    CATARACT EXTRACTION W/PHACO Left 05/10/2022   Procedure: CATARACT EXTRACTION PHACO AND INTRAOCULAR LENS PLACEMENT ([place]) LEFT  CLAREON VIVITY LENS  7.00  00:38.9;  Surgeon: Birder Robson, MD;  Location: [place];  Service: Ophthalmology;  Laterality: Left;   COLONOSCOPY WITH PROPOFOL N/A 03/09/2016   Procedure: COLONOSCOPY WITH PROPOFOL;  Surgeon: Manya Silvas, MD;  Location: [facility] ENDOSCOPY;  Service: Endoscopy;  Laterality: N/A;   ESOPHAGOGASTRODUODENOSCOPY (EGD) WITH PROPOFOL N/A 03/09/2016   Procedure: ESOPHAGOGASTRODUODENOSCOPY (EGD) WITH PROPOFOL;  Surgeon: Manya Silvas, MD;  Location: [facility] ENDOSCOPY;  Service: Endoscopy;  Laterality: N/A;   KNEE ARTHROSCOPY Right    NECK SURGERY     TONSILLECTOMY      Prior to Admission medications   Medication Sig Start Date End Date Taking? Authorizing Provider  b complex vitamins capsule Take 1 capsule by mouth daily.   Yes [provider]  cholecalciferol (VITAMIN D3) 25 MCG (1000 UNIT) tablet Take 1,000 Units by mouth daily.   Yes [provider]  diclofenac sodium (VOLTAREN) 1 % GEL Apply 4 g topically 4 (four) times daily.   Yes  [provider]  diphenhydrAMINE (BENADRYL) 25 MG tablet Take 25 mg by mouth at bedtime as needed. Take 1/2 tablet 12.5 mg   Yes [provider]  fluticasone (FLONASE) 50 MCG/ACT nasal spray Place into both nostrils daily.   Yes [provider]  levothyroxine (SYNTHROID, LEVOTHROID) 50 MCG tablet Take 50 mcg by mouth daily before breakfast.   Yes [provider]  Misc Natural Products (PYCNOGENOL PLUS PO) Take by mouth.   Yes [provider]  acetaminophen (TYLENOL) 500 MG tablet Take 500 mg by mouth every 4 (four) hours as needed.    [provider]  aspirin EC 81 MG tablet Take 81 mg by mouth daily. Patient not taking: Reported on 04/27/2022    [provider]    Allergies as of 04/04/2022 - Review Complete 10/05/2020  Allergen Reaction Noted   Influenza vaccines Anaphylaxis 05/28/2019   Caffeine  03/09/2016   Copper-containing compounds  03/09/2016   Eggs or egg-derived products Swelling 03/09/2016   Nexium [esomeprazole]  03/09/2016   Nickel  03/09/2016   Pseudoephedrine hcl  03/09/2016   Ranitidine hcl  03/09/2016   Tacrolimus  02/26/2020    Family History  Problem Relation Age of Onset   Breast cancer Sister 21   Skin cancer Sister    Diabetes Mellitus II Sister    Hypertension Sister    Melanoma Sister    Hypertension Mother    Heart  failure Mother    Skin cancer Father    Melanoma Father    Autoimmune disease Daughter     Social History   Socioeconomic History   Marital status: Married    Spouse name: Not on file   Number of children: Not on file   Years of education: Not on file   Highest education level: Not on file  Occupational History   Not on file  Tobacco Use   Smoking status: Former   Smokeless tobacco: Never  Substance and Sexual Activity   Alcohol use: No   Drug use: No   Sexual activity: Not Currently    Birth control/protection: Post-menopausal  Other Topics Concern   Not on file   Social History Narrative   Not on file   Social Determinants of Health   Financial Resource Strain: Not on file  Food Insecurity: Not on file  Transportation Needs: Not on file  Physical Activity: Not on file  Stress: Not on file  Social Connections: Not on file  Intimate Partner Violence: Not on file    Review of Systems: See HPI, otherwise negative ROS  Physical Exam: BP (!) 154/75   Temp (!) 97.3 F (36.3 C) (Tympanic)   Resp 13   Ht 5' 2.01" (1.575 m)   Wt 72.1 kg   SpO2 100%   BMI 29.07 kg/m  General:   Alert, cooperative in NAD Head:  Normocephalic and atraumatic. Respiratory:  Normal work of breathing. Cardiovascular:  RRR  Impression/Plan: Bridget Howe is here for cataract surgery.  Risks, benefits, limitations, and alternatives regarding cataract surgery have been reviewed with the patient.  Questions have been answered.  All parties agreeable.   Birder Robson, MD  05/24/2022, 10:41 AM

## 2022-05-24 NOTE — Anesthesia Preprocedure Evaluation (Signed)
Anesthesia Evaluation  Patient identified by MRN, date of birth, ID band Patient awake    Reviewed: Allergy & Precautions, NPO status , Patient's Chart, lab work & pertinent test results  History of Anesthesia Complications (+) history of anesthetic complications (allergy to egg whites and yolks; cannot tolerate propofol)  Airway Mallampati: I   Neck ROM: Full    Dental no notable dental hx.    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 77)  Normal cardiovascular exam Rhythm:Regular Rate:Normal     Neuro/Psych negative neurological ROS     GI/Hepatic negative GI ROS,,,  Endo/Other  Hypothyroidism  Prediabetes   Renal/GU negative Renal ROS     Musculoskeletal   Abdominal   Peds  Hematology negative hematology ROS (+)   Anesthesia Other Findings   Reproductive/Obstetrics                             Anesthesia Physical Anesthesia Plan  ASA: 2  Anesthesia Plan: MAC   Post-op Pain Management:    Induction: Intravenous  PONV Risk Score and Plan: 2 and Treatment may vary due to age or medical condition, Midazolam and TIVA  Airway Management Planned: Natural Airway and Nasal Cannula  Additional Equipment:   Intra-op Plan:   Post-operative Plan:   Informed Consent: I have reviewed the patients History and Physical, chart, labs and discussed the procedure including the risks, benefits and alternatives for the proposed anesthesia with the patient or authorized representative who has indicated his/her understanding and acceptance.     Dental advisory given  Plan Discussed with: CRNA  Anesthesia Plan Comments: (LMA/GETA backup discussed.  Patient consented for risks of anesthesia including but not limited to:  - adverse reactions to medications - damage to eyes, teeth, lips or  other oral mucosa - nerve damage due to positioning  - sore throat or hoarseness - damage to heart, brain, nerves, lungs, other parts of body or loss of life  Informed patient about role of CRNA in peri- and intra-operative care.  Patient voiced understanding.)       Anesthesia Quick Evaluation

## 2022-05-26 ENCOUNTER — Encounter: Payer: Self-pay | Admitting: Ophthalmology

## 2022-06-14 DIAGNOSIS — R7303 Prediabetes: Secondary | ICD-10-CM | POA: Diagnosis not present

## 2022-06-14 DIAGNOSIS — E039 Hypothyroidism, unspecified: Secondary | ICD-10-CM | POA: Diagnosis not present

## 2022-06-21 DIAGNOSIS — R10811 Right upper quadrant abdominal tenderness: Secondary | ICD-10-CM | POA: Diagnosis not present

## 2022-06-21 DIAGNOSIS — E039 Hypothyroidism, unspecified: Secondary | ICD-10-CM | POA: Diagnosis not present

## 2022-06-21 DIAGNOSIS — G4733 Obstructive sleep apnea (adult) (pediatric): Secondary | ICD-10-CM | POA: Diagnosis not present

## 2022-06-21 DIAGNOSIS — R7303 Prediabetes: Secondary | ICD-10-CM | POA: Diagnosis not present

## 2022-06-21 DIAGNOSIS — I779 Disorder of arteries and arterioles, unspecified: Secondary | ICD-10-CM | POA: Insufficient documentation

## 2022-06-21 DIAGNOSIS — Z0000 Encounter for general adult medical examination without abnormal findings: Secondary | ICD-10-CM | POA: Diagnosis not present

## 2022-06-22 ENCOUNTER — Other Ambulatory Visit: Payer: Self-pay

## 2022-06-22 DIAGNOSIS — R10811 Right upper quadrant abdominal tenderness: Secondary | ICD-10-CM

## 2022-06-29 ENCOUNTER — Ambulatory Visit
Admission: RE | Admit: 2022-06-29 | Discharge: 2022-06-29 | Disposition: A | Discharge status: Home / Self Care | Payer: PPO | Source: Ambulatory Visit | Attending: Internal Medicine | Admitting: Internal Medicine

## 2022-06-29 DIAGNOSIS — R10811 Right upper quadrant abdominal tenderness: Secondary | ICD-10-CM | POA: Insufficient documentation

## 2022-06-29 DIAGNOSIS — R109 Unspecified abdominal pain: Secondary | ICD-10-CM | POA: Diagnosis not present

## 2022-08-09 ENCOUNTER — Other Ambulatory Visit: Payer: Self-pay | Admitting: Internal Medicine

## 2022-08-09 DIAGNOSIS — Z1231 Encounter for screening mammogram for malignant neoplasm of breast: Secondary | ICD-10-CM

## 2022-09-14 DIAGNOSIS — H43813 Vitreous degeneration, bilateral: Secondary | ICD-10-CM | POA: Diagnosis not present

## 2022-09-14 DIAGNOSIS — H26492 Other secondary cataract, left eye: Secondary | ICD-10-CM | POA: Diagnosis not present

## 2022-10-10 ENCOUNTER — Ambulatory Visit
Admission: RE | Admit: 2022-10-10 | Discharge: 2022-10-10 | Disposition: A | Discharge status: Home / Self Care | Payer: PPO | Source: Ambulatory Visit | Attending: Internal Medicine | Admitting: Internal Medicine

## 2022-10-10 DIAGNOSIS — Z1231 Encounter for screening mammogram for malignant neoplasm of breast: Secondary | ICD-10-CM | POA: Diagnosis not present

## 2022-10-11 ENCOUNTER — Encounter: Payer: PPO | Admitting: Dermatology

## 2022-11-01 DIAGNOSIS — M1711 Unilateral primary osteoarthritis, right knee: Secondary | ICD-10-CM | POA: Diagnosis not present

## 2022-11-01 DIAGNOSIS — I779 Disorder of arteries and arterioles, unspecified: Secondary | ICD-10-CM | POA: Diagnosis not present

## 2022-11-13 ENCOUNTER — Ambulatory Visit
Admission: EM | Admit: 2022-11-13 | Discharge: 2022-11-13 | Disposition: A | Discharge status: Home / Self Care | Payer: PPO | Attending: Family Medicine | Admitting: Family Medicine

## 2022-11-13 DIAGNOSIS — J22 Unspecified acute lower respiratory infection: Secondary | ICD-10-CM

## 2022-11-13 MED ORDER — AMOXICILLIN-POT CLAVULANATE 875-125 MG PO TABS: 1.0000 | ORAL_TABLET | Freq: Two times a day (BID) | ORAL | 0 refills | Status: AC

## 2022-11-13 MED ORDER — BENZONATATE 100 MG PO CAPS: 200.0000 mg | ORAL_CAPSULE | Freq: Three times a day (TID) | ORAL | 0 refills | Status: DC | PRN

## 2022-11-13 MED ORDER — PREDNISONE 20 MG PO TABS: 20.0000 mg | ORAL_TABLET | Freq: Every day | ORAL | 0 refills | Status: AC

## 2022-11-13 NOTE — Discharge Instructions (Addendum)
We have no imaging onsite today however I would like for you to get a chest x-ray just to ensure that you do not have a pneumonia.  See the attached paperwork with instructions as to where to go to get your chest x-ray and a copy of the order is attached as well.  Augmentin twice daily for 10 days.  Also put you on prednisone 20 mg once daily for total of 5 days to help improve work of breathing.  Benzonatate Perles you may take up to 3 times daily as needed for cough.  Hydrate well with fluids.  Rest.  As discussed I do recommend taking a home COVID test just to ensure that your symptoms are not related to COVID as people with a history of asthma can have complications related to COVID.  If any of your symptoms become severe go to the nearest emergency department if symptoms improve but not completely resolved after completing your antibiotics return for evaluation

## 2022-11-13 NOTE — ED Provider Notes (Signed)
Bridget Howe    CSN: [id number] Arrival date & time: 11/13/22  1058      History   Chief Complaint Chief Complaint  Patient presents with   Fever   Cough   Nasal Congestion    HPI Bridget Howe is a 77 y.o. female, with a history of asthma, sleep apnea, and skin cancer.   HPI Patient presents today with a 4 day history of cough, dizziness, fever. Reports most of her family are sick with similar type of symptoms. She endorses fever TMAX 101, today was first morning without fever. She has been taking tylenol and chloraseptic spray. Cough is non-productive. Endorses clear nasal drainage and has had shortness of breath with activity. Denies wheezing, nausea, vomiting, or headache.  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   Cervical radiculopathy 04/25/2015    Past Surgical History:  Procedure Laterality Date   BREAST CYST ASPIRATION Bilateral    3 on right, 2 on the left   CARDIAC CATHETERIZATION     CARPAL TUNNEL RELEASE Bilateral    CATARACT EXTRACTION W/PHACO Left 05/10/2022   Procedure: CATARACT EXTRACTION PHACO AND INTRAOCULAR LENS PLACEMENT (IOC) LEFT  CLAREON VIVITY LENS  7.00  00:38.9;  Surgeon: Galen Manila, MD;  Location: [facility];  Service: Ophthalmology;  Laterality: Left;   CATARACT EXTRACTION W/PHACO Right 05/24/2022   Procedure: CATARACT EXTRACTION PHACO AND INTRAOCULAR LENS PLACEMENT (IOC) RIGHT CLAREON VIVITY 6.22 00:40.2;  Surgeon: Galen Manila, MD;  Location: [facility];   Service: Ophthalmology;  Laterality: Right;   COLONOSCOPY WITH PROPOFOL N/A 03/09/2016   Procedure: COLONOSCOPY WITH PROPOFOL;  Surgeon: Scot Jun, MD;  Location[facility]COPY;  Service: Endoscopy;  Laterality: N/A;   ESOPHAGOGASTRODUODENOSCOPY (EGD) WITH PROPOFOL N/A 03/09/2016   Procedure: ESOPHAGOGASTRODUODENOSCOPY (EGD) WITH PROPOFOL;  Surgeon: Scot Jun, MD;  Location: [facility] ENDOSCOPY;  Service: Endoscopy;  Laterality: N/A;   KNEE ARTHROSCOPY Right    NECK SURGERY     TONSILLECTOMY      OB History     Gravida  3   Para  3   Term  3   Preterm      AB      Living  3      SAB      IAB      Ectopic      Multiple      Live Births  3            Home Medications    Prior to Admission medications   Medication Sig Start Date End Date Taking? Authorizing Provider  amoxicillin-clavulanate (AUGMENTIN) 875-125 MG tablet Take 1 tablet by mouth every 12 (twelve) hours for 10 days. 11/13/22 11/23/22 Yes Bing Neighbors, NP  benzonatate (TESSALON) 100 MG capsule Take 2 capsules (200 mg total) by mouth 3 (three) times daily as needed for cough. 11/13/22  Yes Bing Neighbors, NP  predniSONE (DELTASONE) 20 MG  tablet Take 1 tablet (20 mg total) by mouth daily with breakfast for 5 days. 11/13/22 11/18/22 Yes Bing Neighbors, NP  acetaminophen (TYLENOL) 500 MG tablet Take 500 mg by mouth every 4 (four) hours as needed.    [provider]  aspirin EC 81 MG tablet Take 81 mg by mouth daily. Patient not taking: Reported on 04/27/2022    [provider]  b complex vitamins capsule Take 1 capsule by mouth daily.    [provider]  cholecalciferol (VITAMIN D3) 25 MCG (1000 UNIT) tablet Take 1,000 Units by mouth daily.    [provider]  diclofenac sodium (VOLTAREN) 1 % GEL Apply 4 g topically 4 (four) times daily.    [provider]  diphenhydrAMINE (BENADRYL) 25 MG tablet Take 25 mg by mouth at bedtime as needed. Take 1/2  tablet 12.5 mg    [provider]  fluticasone (FLONASE) 50 MCG/ACT nasal spray Place into both nostrils daily.    [provider]  levothyroxine (SYNTHROID, LEVOTHROID) 50 MCG tablet Take 50 mcg by mouth daily before breakfast.    [provider]  Misc Natural Products (PYCNOGENOL PLUS PO) Take by mouth.    [provider]    Family History Family History  Problem Relation Age of Onset   Breast cancer Sister 59   Skin cancer Sister    Diabetes Mellitus II Sister    Hypertension Sister    Melanoma Sister    Hypertension Mother    Heart failure Mother    Skin cancer Father    Melanoma Father    Autoimmune disease Daughter     Social History Social History   Tobacco Use   Smoking status: Former   Smokeless tobacco: Never  Substance Use Topics   Alcohol use: No   Drug use: No     Allergies   Influenza vaccines, Caffeine, Copper-containing compounds, Egg-derived products, Nexium [esomeprazole], Nickel, Pseudoephedrine hcl, Ranitidine hcl, and Tacrolimus   Review of Systems Review of Systems Pertinent negatives listed in HPI   Physical Exam Triage Vital Signs ED Triage Vitals  Encounter Vitals Group     BP 11/13/22 1113 133/75     Systolic BP Percentile --      Diastolic BP Percentile --      Pulse Rate 11/13/22 1113 64     Resp 11/13/22 1113 16     Temp 11/13/22 1113 98.6 F (37 C)     Temp Source 11/13/22 1113 Oral     SpO2 11/13/22 1113 95 %     Weight 11/13/22 1110 151 lb 3.2 oz (68.6 kg)     Height 11/13/22 1110 5\' 2"  (1.575 m)     Head Circumference --      Peak Flow --      Pain Score 11/13/22 1110 0     Pain Loc --      Pain Education --      Exclude from Growth Chart --    No data found.  Updated Vital Signs BP 133/75   Pulse 64   Temp 98.6 F (37 C) (Oral)   Resp 16   Ht 5\' 2"  (1.575 m)   Wt 151 lb 3.2 oz (68.6 kg)   SpO2 95%   BMI 27.65 kg/m   Visual Acuity Right Eye Distance:   Left Eye  Distance:   Bilateral Distance:    Right Eye Near:   Left Eye Near:    Bilateral Near:  Physical Exam Vitals reviewed.  Constitutional:      Appearance: She is ill-appearing.  HENT:     Head: Normocephalic and atraumatic.     Right Ear: External ear normal.     Left Ear: External ear normal.     Nose: Congestion and rhinorrhea present.  Eyes:     Extraocular Movements: Extraocular movements intact.     Conjunctiva/sclera: Conjunctivae normal.     Pupils: Pupils are equal, round, and reactive to light.  Cardiovascular:     Rate and Rhythm: Normal rate and regular rhythm.  Pulmonary:     Breath sounds: Transmitted upper airway sounds present. No decreased air movement. Rhonchi present.  Musculoskeletal:     Cervical back: Normal range of motion and neck supple.  Skin:    General: Skin is warm and dry.  Neurological:     General: No focal deficit present.     Mental Status: She is alert.  Psychiatric:        Behavior: Behavior is cooperative.    UC Treatments / Results  Labs (all labs ordered are listed, but only abnormal results are displayed) Labs Reviewed - No data to display  EKG   Radiology No results found.  Procedures Procedures (including critical care time)  Medications Ordered in UC Medications - No data to display  Initial Impression / Assessment and Plan / UC Course  I have reviewed the triage vital signs and the nursing notes.  Pertinent labs & imaging results that were available during my care of the patient were reviewed by me and considered in my medical decision making (see chart for details).    Suspect lower respiratory infection such as bronchitis however no imaging available onsite today.  Will order an outpatient chest x-ray to rule out pneumonia.  Will cover with Augmentin twice daily for 10 days which is empiric treatment for pneumonia along with prednisone 20 mg daily for 5 days, and benzonatate Perles 200 mg 3 times daily as needed  for cough.  Strict return precautions given if any of her symptoms worsen.  Encouraged patient to perform a home COVID test given the abrupt onset of her symptoms along with the associated fever.  Patient verbalized understanding and agreement with plan today. Final Clinical Impressions(s) / UC Diagnoses   Final diagnoses:  Lower respiratory infection (e.g., bronchitis, pneumonia, pneumonitis, pulmonitis)     Discharge Instructions      We have no imaging onsite today however I would like for you to get a chest x-ray just to ensure that you do not have a pneumonia.  See the attached paperwork with instructions as to where to go to get your chest x-ray and a copy of the order is attached as well.  Augmentin twice daily for 10 days.  Also put you on prednisone 20 mg once daily for total of 5 days to help improve work of breathing.  Benzonatate Perles you may take up to 3 times daily as needed for cough.  Hydrate well with fluids.  Rest.  As discussed I do recommend taking a home COVID test just to ensure that your symptoms are not related to COVID as people with a history of asthma can have complications related to COVID.  If any of your symptoms become severe go to the nearest emergency department if symptoms improve but not completely resolved after completing your antibiotics return for evaluation     ED Prescriptions     Medication Sig Dispense Auth. Provider  amoxicillin-clavulanate (AUGMENTIN) 875-125 MG tablet Take 1 tablet by mouth every 12 (twelve) hours for 10 days. 20 tablet Bing Neighbors, NP   predniSONE (DELTASONE) 20 MG tablet Take 1 tablet (20 mg total) by mouth daily with breakfast for 5 days. 5 tablet Bing Neighbors, NP   benzonatate (TESSALON) 100 MG capsule Take 2 capsules (200 mg total) by mouth 3 (three) times daily as needed for cough. 30 capsule Bing Neighbors, NP      PDMP not reviewed this encounter.   Bing Neighbors, NP 11/13/22 1142

## 2022-11-13 NOTE — ED Triage Notes (Signed)
Patient in office today for cough, congestion,weakness, dizziness and fever x3d. Lack of appetite OTC: tylenol, chloraseptic spray  Denies: n/v ,HA

## 2022-11-14 ENCOUNTER — Ambulatory Visit
Admission: RE | Admit: 2022-11-14 | Discharge: 2022-11-14 | Disposition: A | Discharge status: Home / Self Care | Payer: PPO | Attending: Family Medicine | Admitting: Family Medicine

## 2022-11-14 ENCOUNTER — Ambulatory Visit: Admission: RE | Admit: 2022-11-14 | Payer: PPO | Source: Ambulatory Visit

## 2022-11-14 DIAGNOSIS — R42 Dizziness and giddiness: Secondary | ICD-10-CM | POA: Insufficient documentation

## 2022-11-14 DIAGNOSIS — R0602 Shortness of breath: Secondary | ICD-10-CM | POA: Diagnosis not present

## 2022-11-14 DIAGNOSIS — Z87891 Personal history of nicotine dependence: Secondary | ICD-10-CM | POA: Insufficient documentation

## 2022-11-14 DIAGNOSIS — R059 Cough, unspecified: Secondary | ICD-10-CM | POA: Diagnosis not present

## 2022-11-14 DIAGNOSIS — J45909 Unspecified asthma, uncomplicated: Secondary | ICD-10-CM | POA: Diagnosis not present

## 2022-12-03 NOTE — Discharge Instructions (Addendum)
Instructions after Total Knee Replacement   James P. Angie Fava., M.D.    Dept. of Orthopaedics & Sports Medicine [facility] [address] [place], [place]  [address]  Phone: [phone]   Fax: [phone]       [email or web address]       DIET: Drink plenty of non-alcoholic fluids. Resume your normal diet. Include foods high in fiber.  ACTIVITY:  You may use crutches or a walker with weight-bearing as tolerated, unless instructed otherwise. You may be weaned off of the walker or crutches by your Physical Therapist.  Do NOT place pillows under the knee. Anything placed under the knee could limit your ability to straighten the knee.   Continue doing gentle exercises. Exercising will reduce the pain and swelling, increase motion, and prevent muscle weakness.   Please continue to use the TED compression stockings for 6 weeks. You may remove the stockings at night, but should reapply them in the morning. Do not drive or operate any equipment until instructed.  WOUND CARE:  Continue to use the PolarCare or ice packs periodically to reduce pain and swelling. You may bathe or shower after the staples are removed at the first office visit following surgery. The Aquacel bandage remains in place for 7 days postoperatively.  This can be changed out with a honeycomb dressing after this time.  At home PT can help with this.  MEDICATIONS: You may resume your regular medications. Please take the pain medication as prescribed on the medication. Do not take pain medication on an empty stomach. You have been given a prescription for a blood thinner -baby aspirin, 81 mg.  Please take this once in the morning and once at night.  Wear this in conjunction with your TED hose stockings to help prevent DVTs Do not drive or drink alcoholic beverages when taking pain medications.  CALL THE OFFICE FOR: Temperature above 101 degrees Excessive bleeding or drainage on the dressing. Excessive  swelling, coldness, or paleness of the toes. Persistent nausea and vomiting.  FOLLOW-UP:  You should have an appointment to return to the office in 10-14 days after surgery. Arrangements have been made for continuation of Physical Therapy (either home therapy or outpatient therapy).   [facility] Department Directory         [email or web address]       [email or web address]          Cardiology  Appointments: [place] - [phone]  Endocrinology  Appointments: [place] [phone] [place] - [phone]  Gastroenterology  Appointments: [place] [phone] [place] - [phone]        General Surgery   Appointments: [facility]  Internal Medicine/Family Medicine  Appointments: [facility] [place] - [phone] [place] - [phone]  Metabolic and Weigh Loss Surgery  Appointments: [facility]        Neurology  Appointments: [place] [phone] [place] - [phone]  Neurosurgery  Appointments: [place]  Obstetrics & Gynecology  Appointments: [place] [phone] [place] - [phone]        Pediatrics  Appointments: [place] [phone] [place] - [phone]  Physiatry  Appointments: [place] [phone]  Physical Therapy  Appointments: [place] - [phone]        Podiatry  Appointments: [place] [phone] [place] - [phone]  Pulmonology  Appointments: [place]  Rheumatology  Appointments: [place] [phone]        [place] Location: [facility]  [address] [place], [place]  [address]  [place] Location: [facility] [address] [place], [place]  [address]  [place] Location: [facility] [address] [place], [place]  [address]

## 2022-12-06 DIAGNOSIS — M1711 Unilateral primary osteoarthritis, right knee: Secondary | ICD-10-CM | POA: Diagnosis not present

## 2022-12-07 ENCOUNTER — Encounter: Payer: Self-pay | Admitting: Orthopedic Surgery

## 2022-12-07 ENCOUNTER — Other Ambulatory Visit: Payer: Self-pay

## 2022-12-07 ENCOUNTER — Encounter
Admission: RE | Admit: 2022-12-07 | Discharge: 2022-12-07 | Disposition: A | Discharge status: Home / Self Care | Payer: PPO | Source: Ambulatory Visit | Attending: Orthopedic Surgery | Admitting: Orthopedic Surgery

## 2022-12-07 VITALS — BP 145/86 | HR 55 | Resp 16 | Ht 62.0 in | Wt 155.4 lb

## 2022-12-07 DIAGNOSIS — M1711 Unilateral primary osteoarthritis, right knee: Secondary | ICD-10-CM | POA: Diagnosis not present

## 2022-12-07 DIAGNOSIS — Z01812 Encounter for preprocedural laboratory examination: Secondary | ICD-10-CM | POA: Diagnosis not present

## 2022-12-07 DIAGNOSIS — Z01810 Encounter for preprocedural cardiovascular examination: Secondary | ICD-10-CM | POA: Insufficient documentation

## 2022-12-07 DIAGNOSIS — Z01818 Encounter for other preprocedural examination: Secondary | ICD-10-CM | POA: Diagnosis present

## 2022-12-07 DIAGNOSIS — R9431 Abnormal electrocardiogram [ECG] [EKG]: Secondary | ICD-10-CM | POA: Diagnosis not present

## 2022-12-07 HISTORY — DX: Unilateral primary osteoarthritis, right knee: M17.11

## 2022-12-07 HISTORY — DX: Cerebral infarction, unspecified: I63.9

## 2022-12-07 HISTORY — DX: Atherosclerotic heart disease of native coronary artery without angina pectoris: I25.10

## 2022-12-07 HISTORY — DX: Unspecified asthma, uncomplicated: J45.909

## 2022-12-07 HISTORY — DX: Acute myocardial infarction, unspecified: I21.9

## 2022-12-07 HISTORY — DX: COVID-19: U07.1

## 2022-12-07 LAB — URINALYSIS, ROUTINE W REFLEX MICROSCOPIC
Bilirubin Urine: NEGATIVE
Glucose, UA: NEGATIVE mg/dL
Hgb urine dipstick: NEGATIVE
Ketones, ur: NEGATIVE mg/dL
Leukocytes,Ua: NEGATIVE
Nitrite: NEGATIVE
Protein, ur: NEGATIVE mg/dL
Specific Gravity, Urine: 1.016 (ref 1.005–1.030)
pH: 7 (ref 5.0–8.0)

## 2022-12-07 LAB — COMPREHENSIVE METABOLIC PANEL
ALT: 24 U/L (ref 0–44)
AST: 27 U/L (ref 15–41)
Albumin: 3.9 g/dL (ref 3.5–5.0)
Alkaline Phosphatase: 57 U/L (ref 38–126)
Anion gap: 8 (ref 5–15)
BUN: 14 mg/dL (ref 8–23)
CO2: 28 mmol/L (ref 22–32)
Calcium: 9.3 mg/dL (ref 8.9–10.3)
Chloride: 105 mmol/L (ref 98–111)
Creatinine, Ser: 0.83 mg/dL (ref 0.44–1.00)
GFR, Estimated: >60 mL/min (ref >60)
Glucose, Bld: 94 mg/dL (ref 70–99)
Potassium: 4 mmol/L (ref 3.5–5.1)
Sodium: 141 mmol/L (ref 135–145)
Total Bilirubin: 0.8 mg/dL (ref 0.3–1.2)
Total Protein: 7.3 g/dL (ref 6.5–8.1)

## 2022-12-07 LAB — C-REACTIVE PROTEIN: CRP: <0.5 mg/dL (ref <1.0)

## 2022-12-07 LAB — CBC
HCT: 44.5 % (ref 36.0–46.0)
Hemoglobin: 14.7 g/dL (ref 12.0–15.0)
MCH: 32.8 pg (ref 26.0–34.0)
MCHC: 33 g/dL (ref 30.0–36.0)
MCV: 99.3 fL (ref 80.0–100.0)
Platelets: 281 10*3/uL (ref 150–400)
RBC: 4.48 MIL/uL (ref 3.87–5.11)
RDW: 12.7 % (ref 11.5–15.5)
WBC: 5.4 10*3/uL (ref 4.0–10.5)
nRBC: 0 % (ref 0.0–0.2)

## 2022-12-07 LAB — SURGICAL PCR SCREEN
MRSA, PCR: NEGATIVE
Staphylococcus aureus: POSITIVE — AB

## 2022-12-07 LAB — TYPE AND SCREEN
ABO/RH(D): O NEG
Antibody Screen: NEGATIVE

## 2022-12-07 LAB — SEDIMENTATION RATE: Sed Rate: 6 mm/hr (ref 0–30)

## 2022-12-07 NOTE — Patient Instructions (Addendum)
Your procedure is scheduled on: 12/14/22 - Wednesday Report to the Registration Desk on the 1st floor of the [facility]. To find out your arrival time, please call [phone] between 1PM - 3PM on: 12/13/22 - Tuesday If your arrival time is 6:00 am, do not arrive before that time as the [facility] entrance doors do not open until 6:00 am.  REMEMBER: Instructions that are not followed completely may result in serious medical risk, up to and including death; or upon the discretion of your surgeon and anesthesiologist your surgery may need to be rescheduled.  Do not eat food after midnight the night before surgery.  No gum chewing or hard candies.  You may however, drink CLEAR liquids up to 2 hours before you are scheduled to arrive for your surgery. Do not drink anything within 2 hours of your scheduled arrival time.  Clear liquids include: - water  - apple juice without pulp - gatorade (not RED colors) - black coffee or tea (Do NOT add milk or creamers to the coffee or tea) Do NOT drink anything that is not on this list.   In addition, your doctor has ordered for you to drink the provided:  Ensure Pre-Surgery Clear Carbohydrate Drink  Drinking this carbohydrate drink up to two hours before surgery helps to reduce insulin resistance and improve patient outcomes. Please complete drinking 2 hours before scheduled arrival time.  One week prior to surgery: Stop Anti-inflammatories (NSAIDS) such as Advil, Aleve, Ibuprofen, Motrin, Naproxen, Naprosyn and Aspirin based products such as Excedrin, Goody's Powder, BC Powder. You may however, continue to take Tylenol if needed for pain up until the day of surgery.  Stop ANY OVER THE COUNTER supplements until after surgery.   TAKE ONLY THESE MEDICATIONS THE MORNING OF SURGERY WITH A SIP OF WATER:  levothyroxine (SYNTHROID, LEVOTHROID)     No Alcohol for 24 hours before or after surgery.  No Smoking including e-cigarettes for 24 hours  before surgery.  No chewable tobacco products for at least 6 hours before surgery.  No nicotine patches on the day of surgery.  Do not use any "recreational" drugs for at least a week (preferably 2 weeks) before your surgery.  Please be advised that the combination of cocaine and anesthesia may have negative outcomes, up to and including death. If you test positive for cocaine, your surgery will be cancelled.  On the morning of surgery brush your teeth with toothpaste and water, you may rinse your mouth with mouthwash if you wish. Do not swallow any toothpaste or mouthwash.  Use CHG Soap or wipes as directed on instruction sheet.  Do not wear jewelry, make-up, hairpins, clips or nail polish.  Do not wear lotions, powders, or perfumes.   Contact lenses, hearing aids and dentures may not be worn into surgery.  Do not bring valuables to the hospital. [facility] is not responsible for any missing/lost belongings or valuables.   Bring your BI-PAP to the hospital in case you may have to spend the night.   Notify your doctor if there is any change in your medical condition (cold, fever, infection).  Wear comfortable clothing (specific to your surgery type) to the hospital.  After surgery, you can help prevent lung complications by doing breathing exercises.  Take deep breaths and cough every 1-2 hours. Your doctor may order a device called an Incentive Spirometer to help you take deep breaths. When coughing or sneezing, hold a pillow firmly against your incision with both hands.  This is called "splinting." Doing this helps protect your incision. It also decreases belly discomfort.  If you are being admitted to the hospital overnight, leave your suitcase in the car. After surgery it may be brought to your room.  In case of increased patient census, it may be necessary for you, the patient, to continue your postoperative care in the Same Day Surgery department.  If you are being discharged  the day of surgery, you will not be allowed to drive home. You will need a responsible individual to drive you home and stay with you for 24 hours after surgery.   If you are taking public transportation, you will need to have a responsible individual with you.  Please call the Pre-admissions Testing Dept. at [phone] if you have any questions about these instructions.  Surgery Visitation Policy:  Patients having surgery or a procedure may have two visitors.  Children under the age of 19 must have an adult with them who is not the patient.  Inpatient Visitation:    Visiting hours are 7 a.m. to 8 p.m. Up to four visitors are allowed at one time in a patient room. The visitors may rotate out with other people during the day.  One visitor age 64 or older may stay with the patient overnight and must be in the room by 8 p.m.    Pre-operative 5 CHG Bath Instructions   You can play a key role in reducing the risk of infection after surgery. Your skin needs to be as free of germs as possible. You can reduce the number of germs on your skin by washing with CHG (chlorhexidine gluconate) soap before surgery. CHG is an antiseptic soap that kills germs and continues to kill germs even after washing.   DO NOT use if you have an allergy to chlorhexidine/CHG or antibacterial soaps. If your skin becomes reddened or irritated, stop using the CHG and notify one of our RNs at [phone].   Please shower with the CHG soap starting 4 days before surgery using the following schedule: 08/10 - 08/14.    Please keep in mind the following:  DO NOT shave, including legs and underarms, starting the day of your first shower.   You may shave your face at any point before/day of surgery.  Place clean sheets on your bed the day you start using CHG soap. Use a clean washcloth (not used since being washed) for each shower. DO NOT sleep with pets once you start using the CHG.   CHG Shower Instructions:  If  you choose to wash your hair and private area, wash first with your normal shampoo/soap.  After you use shampoo/soap, rinse your hair and body thoroughly to remove shampoo/soap residue.  Turn the water OFF and apply about 3 tablespoons (45 ml) of CHG soap to a CLEAN washcloth.  Apply CHG soap ONLY FROM YOUR NECK DOWN TO YOUR TOES (washing for 3-5 minutes)  DO NOT use CHG soap on face, private areas, open wounds, or sores.  Pay special attention to the area where your surgery is being performed.  If you are having back surgery, having someone wash your back for you may be helpful. Wait 2 minutes after CHG soap is applied, then you may rinse off the CHG soap.  Pat dry with a clean towel  Put on clean clothes/pajamas   If you choose to wear lotion, please use ONLY the CHG-compatible lotions on the back of this paper.  Additional instructions for the day of surgery: DO NOT APPLY any lotions, deodorants, cologne, or perfumes.   Put on clean/comfortable clothes.  Brush your teeth.  Ask your nurse before applying any prescription medications to the skin.      CHG Compatible Lotions   Aveeno Moisturizing lotion  Cetaphil Moisturizing Cream  Cetaphil Moisturizing Lotion  Clairol Herbal Essence Moisturizing Lotion, Dry Skin  Clairol Herbal Essence Moisturizing Lotion, Extra Dry Skin  Clairol Herbal Essence Moisturizing Lotion, Normal Skin  Curel Age Defying Therapeutic Moisturizing Lotion with Alpha Hydroxy  Curel Extreme Care Body Lotion  Curel Soothing Hands Moisturizing Hand Lotion  Curel Therapeutic Moisturizing Cream, Fragrance-Free  Curel Therapeutic Moisturizing Lotion, Fragrance-Free  Curel Therapeutic Moisturizing Lotion, Original Formula  Eucerin Daily Replenishing Lotion  Eucerin Dry Skin Therapy Plus Alpha Hydroxy Crme  Eucerin Dry Skin Therapy Plus Alpha Hydroxy Lotion  Eucerin Original Crme  Eucerin Original Lotion  Eucerin Plus Crme Eucerin Plus Lotion  Eucerin  TriLipid Replenishing Lotion  Keri Anti-Bacterial Hand Lotion  Keri Deep Conditioning Original Lotion Dry Skin Formula Softly Scented  Keri Deep Conditioning Original Lotion, Fragrance Free Sensitive Skin Formula  Keri Lotion Fast Absorbing Fragrance Free Sensitive Skin Formula  Keri Lotion Fast Absorbing Softly Scented Dry Skin Formula  Keri Original Lotion  Keri Skin Renewal Lotion Keri Silky Smooth Lotion  Keri Silky Smooth Sensitive Skin Lotion  Nivea Body Creamy Conditioning Oil  Nivea Body Extra Enriched Lotion  Nivea Body Original Lotion  Nivea Body Sheer Moisturizing Lotion Nivea Crme  Nivea Skin Firming Lotion  NutraDerm 30 Skin Lotion  NutraDerm Skin Lotion  NutraDerm Therapeutic Skin Cream  NutraDerm Therapeutic Skin Lotion  ProShield Protective Hand Cream  Provon moisturizing lotion  How to Use an Incentive Spirometer  An incentive spirometer is a tool that measures how well you are filling your lungs with each breath. Learning to take long, deep breaths using this tool can help you keep your lungs clear and active. This may help to reverse or lessen your chance of developing breathing (pulmonary) problems, especially infection. You may be asked to use a spirometer: After a surgery. If you have a lung problem or a history of smoking. After a long period of time when you have been unable to move or be active. If the spirometer includes an indicator to show the highest number that you have reached, your health care provider or respiratory therapist will help you set a goal. Keep a log of your progress as told by your health care provider. What are the risks? Breathing too quickly may cause dizziness or cause you to pass out. Take your time so you do not get dizzy or light-headed. If you are in pain, you may need to take pain medicine before doing incentive spirometry. It is harder to take a deep breath if you are having pain. How to use your incentive spirometer  Sit up on  the edge of your bed or on a chair. Hold the incentive spirometer so that it is in an upright position. Before you use the spirometer, breathe out normally. Place the mouthpiece in your mouth. Make sure your lips are closed tightly around it. Breathe in slowly and as deeply as you can through your mouth, causing the piston or the ball to rise toward the top of the chamber. Hold your breath for 3-5 seconds, or for as long as possible. If the spirometer includes a coach indicator, use this to guide you in breathing. Slow down your  breathing if the indicator goes above the marked areas. Remove the mouthpiece from your mouth and breathe out normally. The piston or ball will return to the bottom of the chamber. Rest for a few seconds, then repeat the steps 10 or more times. Take your time and take a few normal breaths between deep breaths so that you do not get dizzy or light-headed. Do this every 1-2 hours when you are awake. If the spirometer includes a goal marker to show the highest number you have reached (best effort), use this as a goal to work toward during each repetition. After each set of 10 deep breaths, cough a few times. This will help to make sure that your lungs are clear. If you have an incision on your chest or abdomen from surgery, place a pillow or a rolled-up towel firmly against the incision when you cough. This can help to reduce pain while taking deep breaths and coughing. General tips When you are able to get out of bed: Walk around often. Continue to take deep breaths and cough in order to clear your lungs. Keep using the incentive spirometer until your health care provider says it is okay to stop using it. If you have been in the hospital, you may be told to keep using the spirometer at home. Contact a health care provider if: You are having difficulty using the spirometer. You have trouble using the spirometer as often as instructed. Your pain medicine is not giving  enough relief for you to use the spirometer as told. You have a fever. Get help right away if: You develop shortness of breath. You develop a cough with bloody mucus from the lungs. You have fluid or blood coming from an incision site after you cough. Summary An incentive spirometer is a tool that can help you learn to take long, deep breaths to keep your lungs clear and active. You may be asked to use a spirometer after a surgery, if you have a lung problem or a history of smoking, or if you have been inactive for a long period of time. Use your incentive spirometer as instructed every 1-2 hours while you are awake. If you have an incision on your chest or abdomen, place a pillow or a rolled-up towel firmly against your incision when you cough. This will help to reduce pain. Get help right away if you have shortness of breath, you cough up bloody mucus, or blood comes from your incision when you cough. This information is not intended to replace advice given to you by your health care provider. Make sure you discuss any questions you have with your health care provider. Document Revised: 07/08/2019 Document Reviewed: 07/08/2019 Elsevier Patient Education  2023 Elsevier Inc.   POLAR CARE INFORMATION  MassAdvertisement.it  How to use [facility] Therapy System?  YouTube   ShippingScam.co.uk  OPERATING INSTRUCTIONS  Start the product With dry hands, connect the transformer to the electrical connection located on the top of the cooler. Next, plug the transformer into an appropriate electrical outlet. The unit will automatically start running at this point.  To stop the pump, disconnect electrical power.  Unplug to stop the product when not in use. Unplugging the Polar Care unit turns it off. Always unplug immediately after use. Never leave it plugged in while unattended. Remove pad.    FIRST ADD WATER TO FILL LINE, THEN ICE---Replace ice when existing  ice is almost melted  1 Discuss Treatment with your Licensed  Health Care Practitioner and Use Only as Prescribed 2 Apply Insulation Barrier & Cold Therapy Pad 3 Check for Moisture 4 Inspect Skin Regularly  Tips and Trouble Shooting Usage Tips 1. Use cubed or chunked ice for optimal performance. 2. It is recommended to drain the Pad between uses. To drain the pad, hold the Pad upright with the hose pointed toward the ground. Depress the black plunger and allow water to drain out. 3. You may disconnect the Pad from the unit without removing the pad from the affected area by depressing the silver tabs on the hose coupling and gently pulling the hoses apart. The Pad and unit will seal itself and will not leak. Note: Some dripping during release is normal. 4. DO NOT RUN PUMP WITHOUT WATER! The pump in this unit is designed to run with water. Running the unit without water will cause permanent damage to the pump. 5. Unplug unit before removing lid.  TROUBLESHOOTING GUIDE Pump not running, Water not flowing to the pad, Pad is not getting cold 1. Make sure the transformer is plugged into the wall outlet. 2. Confirm that the ice and water are filled to the indicated levels. 3. Make sure there are no kinks in the pad. 4. Gently pull on the blue tube to make sure the tube/pad junction is straight. 5. Remove the pad from the treatment site and ll it while the pad is lying at; then reapply. 6. Confirm that the pad couplings are securely attached to the unit. Listen for the double clicks (Figure 1) to confirm the pad couplings are securely attached.  Leaks    Note: Some condensation on the lines, controller, and pads is unavoidable, especially in warmer climates. 1. If using a [employer] Polar Care Cold Therapy unit with a detachable Cold Therapy Pad, and a leak exists (other than condensation on the lines) disconnect the pad couplings. Make sure the silver tabs on the couplings are depressed before reconnecting  the pad to the pump hose; then confirm both sides of the coupling are properly clicked in. 2. If the coupling continues to leak or a leak is detected in the pad itself, stop using it and call [employer] Customer Care at [phone].  Cleaning After use, empty and dry the unit with a soft cloth. Warm water and mild detergent may be used occasionally to clean the pump and tubes.  WARNING: The Polar Care Cube can be cold enough to cause serious injury, including full skin necrosis. Follow these Operating Instructions, and carefully read the Product Insert (see pouch on side of unit) and the Cold Therapy Pad Fitting Instructions (provided with each Cold Therapy Pad) prior to use.

## 2022-12-09 NOTE — H&P (Signed)
ORTHOPAEDIC HISTORY & PHYSICAL Latanya Maudlin, PA - 12/06/2022 11:00 AM EDT Formatting of this note is different from the original. Images from the original note were not included. Chief Complaint Chief Complaint Patient presents with Knee Pain H & P RIGHT KNEE  Reason for Visit Bridget Howe is a 77 y.o. who presents today for a history and physical. She is to undergo a right total knee arthroplasty on 12/14/2022. Since her last visit at the clinic there have been no improvement in her condition. She expresses her desire to proceed with surgery.  She has a several year history of right knee pain. She localizes most of the pain along the medial aspect of the knee. She reports some swelling, no locking, and significant giving way of the knee. The pain is aggravated by any weight bearing. The knee pain limits the patient's ability to ambulate long distances. The patient has not appreciated any significant improvement despite Tylenol, glucosamine/chondroitin, topical NSAIDs, intraarticular corticosteroid injections, and activity modification. She is not using any ambulatory aids. The patient states that the knee pain has progressed to the point that it is significantly interfering with her activities of daily living.   Past Medical History Past Medical History: Diagnosis Date Hemorrhoids History of chicken pox Hyperglycemia Sleep apnea Squamous cell skin cancer Thyroid disease  Past Surgical History Past Surgical History: Procedure Laterality Date COLONOSCOPY 02/12/2007 Int Hemorrhoids: CBF 01/2017 EGD 11/08/2011 COLONOSCOPY 03/09/2016 Int Hemorrhoids, Diverticulosis: CBF 03/2026 EGD 03/09/2016 Gastritis: No repeat per RTE C5-6 FUSION ENDOSCOPIC CARPAL TUNNEL RELEASE Bilateral KNEE ARTHROSCOPY Microdiskectomy C5/6 Repair of tendon TONSILLECTOMY TRIGGER FINGER RELEASE Right Wisdom teeth  Past Family History Family History Problem Relation Age of Onset Heart  failure Mother Breast cancer Sister Diabetes type II Sister Ulcerative colitis Daughter Dysphagia Daughter Dysphagia Other Colon polyps Neg Hx Colon cancer Neg Hx  Medications Current Outpatient Medications Medication Sig Dispense Refill acetaminophen (TYLENOL) 500 mg capsule Take 500 mg by mouth every 4 (four) hours as needed for Fever. cholecalciferol, vitamin D3, (D3-5000 ORAL) Take 1 tablet by mouth once daily diclofenac (VOLTAREN) 1 % topical gel APPLY TO AFFECTED AREA 4 TIMES A DAY 400 g 5 diphenhydrAMINE (BENADRYL) 12.5 mg chewable tablet Take 12.5 mg by mouth at bedtime as needed for Allergies fluticasone propionate (FLONASE) 50 mcg/actuation nasal spray SPRAY 2 SPRAYS INTO EACH NOSTRIL EVERY DAY 48 g 1 GLUCOSAMINE HCL/CHONDR SU A NA (OSTEO BI-FLEX ORAL) Take 1 tablet by mouth once daily. KRILL/OM-3/DHA/EPA/PHOSPHO/AST (MEGARED OMEGA-3 KRILL OIL ORAL) Take 1 capsule by mouth once daily. levothyroxine (SYNTHROID) 50 MCG tablet TAKE 1 TABLET BY MOUTH IN THE MORNING AS DIRECTED 90 tablet 1 methyl salicylate/menthol (BENGAY GREASELESS TOPICAL) Apply topically as needed MV,CA,MIN/FA/HERBAL NO.157 (ESTROVEN MAXIMUM STRENGTH ORAL) Take 1 tablet by mouth at bedtime polydextrose (FIBERWELL ORAL) Take 2 Gum by mouth once daily PROCYANIDOLIC OLIGOMERS (PYCNOGENOL ORAL) Take 1 tablet by mouth once daily. vitamin B complex (B COMPLEX ORAL) Take 1 tablet by mouth once daily wheat dextrin (BENEFIBER HEALTHY SHAPE) 5 gram/7.4 gram Powd Take 2 tsp/30 mL by mouth once daily  No current facility-administered medications for this visit.  Allergies Allergies Allergen Reactions Influenza Virus Vaccines Anaphylaxis Cat/Feline Products Hives and Swelling States should not have cat gut sutures Copper Unknown Egg Swelling Nasal-Sinus Decongestant [Pseudoephedrine Hcl] Other (See Comments) Heart problems Nickel Unknown Ranitidine Hcl Palpitations Sutures Unknown Catgut suture  material Caffeine Palpitations Nexium [Esomeprazole Magnesium] Cough   Review of Systems A comprehensive 14 point ROS was performed, reviewed, and the  pertinent orthopaedic findings are documented in the HPI.  Exam BP 130/80 (BP Location: Left upper arm, Patient Position: Sitting, BP Cuff Size: Adult)  Ht 157.5 cm (5\' 2" )  Wt 70.5 kg (155 lb 6.4 oz)  BMI 28.42 kg/m  General: Well-developed well-nourished female seen in no acute distress.  HEENT: Atraumatic,normocephalic. Pupils are equal and reactive to light. Oropharynx is clear with moist mucosa  Lungs: Clear to auscultation bilaterally  Cardiovascular: Regular rate and rhythm. Normal S1, S2. No murmurs. No appreciable gallops or rubs. Peripheral pulses are palpable.  Abdomen: Soft, non-tender, nondistended. Bowel sounds present  Extremity: Right Knee: Soft tissue swelling: minimal Effusion: none Erythema: none Crepitance: mild Tenderness: medial Alignment: relative varus Mediolateral laxity: medial pseudolaxity Posterior sag: negative Patellar tracking: Good tracking without evidence of subluxation or tilt Atrophy: No significant atrophy. Quadriceps tone was fair to good. Range of motion: 0/12/106 degrees   Neurological:  The patient is alert and oriented Sensation to light touch appears to be intact and within normal limits Gross motor strength appeared to be equal to 5/5  Vascular :  Peripheral pulses felt to be palpable. Capillary refill appears to be intact and within normal limits  X-ray  1. X-rays taken of the right knee on 11/01/2022 showed significant narrowing of the medial cartilage space with near bone-on-bone articulation and associated varus alignment. Osteophyte formation is noted. Subchondral sclerosis is noted. No evidence of fracture or dislocation.  Impression  1. Degenerative arthrosis of the right knee  Plan  1. Patient medication was gone over on today's visit 2. Past medical  history reviewed 3. Did discuss postop rehab course 4. Return to clinic postop as scheduled. Sooner if any problems  This note was generated in part with voice recognition software and I apologize for any typographical errors that were not detected and corrected   Tera Partridge PA Electronically signed by Latanya Maudlin, PA at 12/06/2022 11:55 AM EDT

## 2022-12-13 MED ORDER — CHLORHEXIDINE GLUCONATE 0.12 % MT SOLN
15.0000 mL | Freq: Once | OROMUCOSAL | Status: AC
  Administered 2022-12-14: 15 mL via OROMUCOSAL

## 2022-12-13 MED ORDER — CELECOXIB 200 MG PO CAPS
400.0000 mg | ORAL_CAPSULE | Freq: Once | ORAL | Status: AC
  Administered 2022-12-14: 400 mg via ORAL

## 2022-12-13 MED ORDER — CHLORHEXIDINE GLUCONATE 4 % EX SOLN
60.0000 mL | Freq: Once | CUTANEOUS | Status: AC
  Administered 2022-12-14: 4 via TOPICAL

## 2022-12-13 MED ORDER — DEXAMETHASONE SODIUM PHOSPHATE 10 MG/ML IJ SOLN
8.0000 mg | Freq: Once | INTRAMUSCULAR | Status: AC
  Administered 2022-12-14: 8 mg via INTRAVENOUS

## 2022-12-13 MED ORDER — TRANEXAMIC ACID-NACL 1000-0.7 MG/100ML-% IV SOLN
1000.0000 mg | INTRAVENOUS | Status: AC
  Administered 2022-12-14: 1000 mg via INTRAVENOUS

## 2022-12-13 MED ORDER — CEFAZOLIN SODIUM-DEXTROSE 2-4 GM/100ML-% IV SOLN
2.0000 g | INTRAVENOUS | Status: AC
  Administered 2022-12-14: 2 g via INTRAVENOUS

## 2022-12-13 MED ORDER — ORAL CARE MOUTH RINSE: 15.0000 mL | Freq: Once | OROMUCOSAL | Status: AC

## 2022-12-13 MED ORDER — GABAPENTIN 300 MG PO CAPS
300.0000 mg | ORAL_CAPSULE | Freq: Once | ORAL | Status: AC
  Administered 2022-12-14: 300 mg via ORAL

## 2022-12-13 MED ORDER — LACTATED RINGERS IV SOLN: INTRAVENOUS | Status: DC

## 2022-12-14 ENCOUNTER — Observation Stay: Payer: PPO

## 2022-12-14 ENCOUNTER — Ambulatory Visit: Payer: PPO | Admitting: Urgent Care

## 2022-12-14 ENCOUNTER — Encounter: Payer: Self-pay | Admitting: Orthopedic Surgery

## 2022-12-14 ENCOUNTER — Other Ambulatory Visit: Payer: Self-pay

## 2022-12-14 ENCOUNTER — Encounter
Admission: RE | Disposition: A | Discharge status: Home / Self Care | Payer: Self-pay | Source: Ambulatory Visit | Attending: Orthopedic Surgery

## 2022-12-14 ENCOUNTER — Observation Stay
Admission: RE | Admit: 2022-12-14 | Discharge: 2022-12-15 | Disposition: A | Discharge status: Home / Self Care | Payer: PPO | Source: Ambulatory Visit | Attending: Orthopedic Surgery | Admitting: Orthopedic Surgery

## 2022-12-14 DIAGNOSIS — Z85828 Personal history of other malignant neoplasm of skin: Secondary | ICD-10-CM | POA: Insufficient documentation

## 2022-12-14 DIAGNOSIS — M1711 Unilateral primary osteoarthritis, right knee: Principal | ICD-10-CM | POA: Insufficient documentation

## 2022-12-14 DIAGNOSIS — I2510 Atherosclerotic heart disease of native coronary artery without angina pectoris: Secondary | ICD-10-CM | POA: Diagnosis not present

## 2022-12-14 DIAGNOSIS — Z96651 Presence of right artificial knee joint: Secondary | ICD-10-CM | POA: Diagnosis not present

## 2022-12-14 DIAGNOSIS — Z471 Aftercare following joint replacement surgery: Secondary | ICD-10-CM | POA: Diagnosis not present

## 2022-12-14 DIAGNOSIS — Z96659 Presence of unspecified artificial knee joint: Secondary | ICD-10-CM

## 2022-12-14 DIAGNOSIS — Z87891 Personal history of nicotine dependence: Secondary | ICD-10-CM | POA: Diagnosis not present

## 2022-12-14 DIAGNOSIS — Z01812 Encounter for preprocedural laboratory examination: Secondary | ICD-10-CM

## 2022-12-14 HISTORY — DX: Obstructive sleep apnea (adult) (pediatric): G47.33

## 2022-12-14 HISTORY — PX: KNEE ARTHROPLASTY: SHX992

## 2022-12-14 SURGERY — ARTHROPLASTY, KNEE, TOTAL, USING IMAGELESS COMPUTER-ASSISTED NAVIGATION
Anesthesia: Spinal | Site: Knee | Laterality: Right

## 2022-12-14 MED ORDER — CELECOXIB 200 MG PO CAPS
ORAL_CAPSULE | ORAL | Status: AC
  Filled 2022-12-14: qty 2

## 2022-12-14 MED ORDER — TRAMADOL HCL 50 MG PO TABS
50.0000 mg | ORAL_TABLET | ORAL | Status: DC | PRN
  Administered 2022-12-14: 50 mg via ORAL
  Administered 2022-12-14: 100 mg via ORAL

## 2022-12-14 MED ORDER — CELECOXIB 200 MG PO CAPS
200.0000 mg | ORAL_CAPSULE | Freq: Two times a day (BID) | ORAL | Status: DC
  Administered 2022-12-14 – 2022-12-15 (×2): 200 mg via ORAL

## 2022-12-14 MED ORDER — DEXMEDETOMIDINE HCL IN NACL 80 MCG/20ML IV SOLN
INTRAVENOUS | Status: AC
  Filled 2022-12-14: qty 20

## 2022-12-14 MED ORDER — DEXAMETHASONE SODIUM PHOSPHATE 10 MG/ML IJ SOLN
INTRAMUSCULAR | Status: AC
  Filled 2022-12-14: qty 1

## 2022-12-14 MED ORDER — ONDANSETRON HCL 4 MG/2ML IJ SOLN: 4.0000 mg | Freq: Four times a day (QID) | INTRAMUSCULAR | Status: DC | PRN

## 2022-12-14 MED ORDER — TRANEXAMIC ACID-NACL 1000-0.7 MG/100ML-% IV SOLN
1000.0000 mg | Freq: Once | INTRAVENOUS | Status: AC
  Administered 2022-12-14: 1000 mg via INTRAVENOUS

## 2022-12-14 MED ORDER — SURGIPHOR WOUND IRRIGATION SYSTEM - OPTIME: TOPICAL | Status: DC | PRN

## 2022-12-14 MED ORDER — MAGNESIUM HYDROXIDE 400 MG/5ML PO SUSP
30.0000 mL | Freq: Every day | ORAL | Status: DC
  Administered 2022-12-14 – 2022-12-15 (×2): 30 mL via ORAL

## 2022-12-14 MED ORDER — DIPHENHYDRAMINE HCL 12.5 MG/5ML PO ELIX: 12.5000 mg | ORAL_SOLUTION | ORAL | Status: DC | PRN

## 2022-12-14 MED ORDER — FLEET ENEMA RE ENEM: 1.0000 | ENEMA | Freq: Once | RECTAL | Status: DC | PRN

## 2022-12-14 MED ORDER — ASPIRIN 81 MG PO CHEW
CHEWABLE_TABLET | ORAL | Status: AC
  Filled 2022-12-14: qty 1

## 2022-12-14 MED ORDER — CEFAZOLIN SODIUM-DEXTROSE 2-4 GM/100ML-% IV SOLN
INTRAVENOUS | Status: AC
  Filled 2022-12-14: qty 100

## 2022-12-14 MED ORDER — ACETAMINOPHEN 10 MG/ML IV SOLN
1000.0000 mg | Freq: Four times a day (QID) | INTRAVENOUS | Status: DC
  Administered 2022-12-15 (×2): 1000 mg via INTRAVENOUS

## 2022-12-14 MED ORDER — SODIUM CHLORIDE 0.9 % IR SOLN
Status: DC | PRN
  Administered 2022-12-14: 3000 mL

## 2022-12-14 MED ORDER — TRANEXAMIC ACID-NACL 1000-0.7 MG/100ML-% IV SOLN
INTRAVENOUS | Status: AC
  Filled 2022-12-14: qty 100

## 2022-12-14 MED ORDER — MIDAZOLAM HCL 5 MG/5ML IJ SOLN
INTRAMUSCULAR | Status: DC | PRN
  Administered 2022-12-14: .5 mg via INTRAVENOUS
  Administered 2022-12-14: 1 mg via INTRAVENOUS
  Administered 2022-12-14 (×3): .5 mg via INTRAVENOUS

## 2022-12-14 MED ORDER — ACETAMINOPHEN 10 MG/ML IV SOLN
INTRAVENOUS | Status: DC | PRN
  Administered 2022-12-14: 1000 mg via INTRAVENOUS

## 2022-12-14 MED ORDER — TRAMADOL HCL 50 MG PO TABS
ORAL_TABLET | ORAL | Status: AC
  Filled 2022-12-14: qty 1

## 2022-12-14 MED ORDER — MENTHOL 3 MG MT LOZG: 1.0000 | LOZENGE | OROMUCOSAL | Status: DC | PRN

## 2022-12-14 MED ORDER — KETAMINE HCL 10 MG/ML IJ SOLN
INTRAMUSCULAR | Status: DC | PRN
Start: 2022-12-14 — End: 2022-12-14
  Administered 2022-12-14 (×2): 10 mg via INTRAVENOUS
  Administered 2022-12-14: 20 mg via INTRAVENOUS
  Administered 2022-12-14: 10 mg via INTRAVENOUS

## 2022-12-14 MED ORDER — GLYCOPYRROLATE 0.2 MG/ML IJ SOLN
INTRAMUSCULAR | Status: DC | PRN
  Administered 2022-12-14: .2 mg via INTRAVENOUS

## 2022-12-14 MED ORDER — BISACODYL 10 MG RE SUPP: 10.0000 mg | Freq: Every day | RECTAL | Status: DC | PRN

## 2022-12-14 MED ORDER — ACETAMINOPHEN 10 MG/ML IV SOLN
INTRAVENOUS | Status: AC
  Filled 2022-12-14: qty 100

## 2022-12-14 MED ORDER — ACETAMINOPHEN 325 MG PO TABS: 325.0000 mg | ORAL_TABLET | Freq: Four times a day (QID) | ORAL | Status: DC | PRN

## 2022-12-14 MED ORDER — OXYCODONE HCL 5 MG PO TABS: 5.0000 mg | ORAL_TABLET | Freq: Once | ORAL | Status: DC | PRN

## 2022-12-14 MED ORDER — LEVOTHYROXINE SODIUM 50 MCG PO TABS
50.0000 ug | ORAL_TABLET | Freq: Every day | ORAL | Status: DC
  Administered 2022-12-15: 50 ug via ORAL
  Filled 2022-12-14: qty 1

## 2022-12-14 MED ORDER — SODIUM CHLORIDE 0.9 % IV SOLN: INTRAVENOUS | Status: DC

## 2022-12-14 MED ORDER — BUPIVACAINE HCL (PF) 0.25 % IJ SOLN
INTRAMUSCULAR | Status: AC
  Filled 2022-12-14: qty 60

## 2022-12-14 MED ORDER — ALUM & MAG HYDROXIDE-SIMETH 200-200-20 MG/5ML PO SUSP: 30.0000 mL | ORAL | Status: DC | PRN

## 2022-12-14 MED ORDER — BUPIVACAINE HCL (PF) 0.5 % IJ SOLN
INTRAMUSCULAR | Status: AC
  Filled 2022-12-14: qty 10

## 2022-12-14 MED ORDER — PANTOPRAZOLE SODIUM 40 MG PO TBEC
DELAYED_RELEASE_TABLET | ORAL | Status: AC
  Filled 2022-12-14: qty 1

## 2022-12-14 MED ORDER — PROPOFOL 1000 MG/100ML IV EMUL
INTRAVENOUS | Status: AC
  Filled 2022-12-14: qty 100

## 2022-12-14 MED ORDER — FENTANYL CITRATE (PF) 100 MCG/2ML IJ SOLN: 25.0000 ug | INTRAMUSCULAR | Status: DC | PRN

## 2022-12-14 MED ORDER — METOCLOPRAMIDE HCL 10 MG PO TABS
ORAL_TABLET | ORAL | Status: AC
  Filled 2022-12-14: qty 1

## 2022-12-14 MED ORDER — MIDAZOLAM HCL 2 MG/2ML IJ SOLN
INTRAMUSCULAR | Status: AC
  Filled 2022-12-14: qty 2

## 2022-12-14 MED ORDER — HYDROMORPHONE HCL 1 MG/ML IJ SOLN: 0.5000 mg | INTRAMUSCULAR | Status: DC | PRN

## 2022-12-14 MED ORDER — ONDANSETRON HCL 4 MG/2ML IJ SOLN
INTRAMUSCULAR | Status: DC | PRN
  Administered 2022-12-14: 4 mg via INTRAVENOUS

## 2022-12-14 MED ORDER — OXYCODONE HCL 5 MG PO TABS: 5.0000 mg | ORAL_TABLET | ORAL | Status: DC | PRN

## 2022-12-14 MED ORDER — PHENYLEPHRINE HCL-NACL 20-0.9 MG/250ML-% IV SOLN
INTRAVENOUS | Status: AC
  Filled 2022-12-14: qty 250

## 2022-12-14 MED ORDER — ONDANSETRON HCL 4 MG PO TABS: 4.0000 mg | ORAL_TABLET | Freq: Four times a day (QID) | ORAL | Status: DC | PRN

## 2022-12-14 MED ORDER — MAGNESIUM HYDROXIDE 400 MG/5ML PO SUSP
ORAL | Status: AC
  Filled 2022-12-14: qty 30

## 2022-12-14 MED ORDER — FERROUS SULFATE 325 (65 FE) MG PO TABS
325.0000 mg | ORAL_TABLET | Freq: Two times a day (BID) | ORAL | Status: DC
  Administered 2022-12-14 – 2022-12-15 (×2): 325 mg via ORAL

## 2022-12-14 MED ORDER — METOCLOPRAMIDE HCL 10 MG PO TABS
10.0000 mg | ORAL_TABLET | Freq: Three times a day (TID) | ORAL | Status: DC
  Administered 2022-12-14 – 2022-12-15 (×3): 10 mg via ORAL

## 2022-12-14 MED ORDER — KETAMINE HCL 50 MG/5ML IJ SOSY
PREFILLED_SYRINGE | INTRAMUSCULAR | Status: AC
  Filled 2022-12-14: qty 5

## 2022-12-14 MED ORDER — PHENOL 1.4 % MT LIQD: 1.0000 | OROMUCOSAL | Status: DC | PRN

## 2022-12-14 MED ORDER — ASPIRIN 81 MG PO CHEW
81.0000 mg | CHEWABLE_TABLET | Freq: Two times a day (BID) | ORAL | Status: DC
  Administered 2022-12-14 – 2022-12-15 (×2): 81 mg via ORAL

## 2022-12-14 MED ORDER — BUPIVACAINE HCL (PF) 0.5 % IJ SOLN
INTRAMUSCULAR | Status: DC | PRN
  Administered 2022-12-14: 3 mL via INTRATHECAL

## 2022-12-14 MED ORDER — FERROUS SULFATE 325 (65 FE) MG PO TABS
ORAL_TABLET | ORAL | Status: AC
  Filled 2022-12-14: qty 1

## 2022-12-14 MED ORDER — CHLORHEXIDINE GLUCONATE 0.12 % MT SOLN
OROMUCOSAL | Status: AC
  Filled 2022-12-14: qty 15

## 2022-12-14 MED ORDER — SODIUM CHLORIDE (PF) 0.9 % IJ SOLN
INTRAMUSCULAR | Status: DC | PRN
  Administered 2022-12-14: 120 mL via INTRAMUSCULAR

## 2022-12-14 MED ORDER — CEFAZOLIN SODIUM-DEXTROSE 2-4 GM/100ML-% IV SOLN
2.0000 g | Freq: Four times a day (QID) | INTRAVENOUS | Status: AC
  Administered 2022-12-14 (×2): 2 g via INTRAVENOUS

## 2022-12-14 MED ORDER — FLUTICASONE PROPIONATE 50 MCG/ACT NA SUSP
2.0000 | Freq: Every day | NASAL | Status: DC
  Administered 2022-12-15: 2 via NASAL
  Filled 2022-12-14: qty 16

## 2022-12-14 MED ORDER — PANTOPRAZOLE SODIUM 40 MG PO TBEC
40.0000 mg | DELAYED_RELEASE_TABLET | Freq: Two times a day (BID) | ORAL | Status: DC
  Administered 2022-12-14: 40 mg via ORAL

## 2022-12-14 MED ORDER — BUPIVACAINE LIPOSOME 1.3 % IJ SUSP
INTRAMUSCULAR | Status: AC
  Filled 2022-12-14: qty 20

## 2022-12-14 MED ORDER — GABAPENTIN 300 MG PO CAPS
ORAL_CAPSULE | ORAL | Status: AC
  Filled 2022-12-14: qty 1

## 2022-12-14 MED ORDER — DEXMEDETOMIDINE HCL IN NACL 80 MCG/20ML IV SOLN
INTRAVENOUS | Status: DC | PRN
  Administered 2022-12-14: 12 ug via INTRAVENOUS
  Administered 2022-12-14: 8 ug via INTRAVENOUS
  Administered 2022-12-14: .4 ug/kg/h via INTRAVENOUS

## 2022-12-14 MED ORDER — PHENYLEPHRINE 80 MCG/ML (10ML) SYRINGE FOR IV PUSH (FOR BLOOD PRESSURE SUPPORT)
PREFILLED_SYRINGE | INTRAVENOUS | Status: AC
  Filled 2022-12-14: qty 10

## 2022-12-14 MED ORDER — OXYCODONE HCL 5 MG/5ML PO SOLN: 5.0000 mg | Freq: Once | ORAL | Status: DC | PRN

## 2022-12-14 MED ORDER — CELECOXIB 200 MG PO CAPS
ORAL_CAPSULE | ORAL | Status: AC
  Filled 2022-12-14: qty 1

## 2022-12-14 MED ORDER — SENNOSIDES-DOCUSATE SODIUM 8.6-50 MG PO TABS
ORAL_TABLET | ORAL | Status: AC
  Filled 2022-12-14: qty 1

## 2022-12-14 MED ORDER — SODIUM CHLORIDE FLUSH 0.9 % IV SOLN
INTRAVENOUS | Status: AC
  Filled 2022-12-14: qty 40

## 2022-12-14 MED ORDER — ENSURE PRE-SURGERY PO LIQD
296.0000 mL | Freq: Once | ORAL | Status: AC
  Administered 2022-12-14: 296 mL via ORAL
  Filled 2022-12-14: qty 296

## 2022-12-14 MED ORDER — OXYCODONE HCL 5 MG PO TABS: 10.0000 mg | ORAL_TABLET | ORAL | Status: DC | PRN

## 2022-12-14 MED ORDER — SENNOSIDES-DOCUSATE SODIUM 8.6-50 MG PO TABS
1.0000 | ORAL_TABLET | Freq: Two times a day (BID) | ORAL | Status: DC
  Administered 2022-12-14 – 2022-12-15 (×2): 1 via ORAL

## 2022-12-14 MED ORDER — TRAMADOL HCL 50 MG PO TABS
ORAL_TABLET | ORAL | Status: AC
  Filled 2022-12-14: qty 2

## 2022-12-14 SURGICAL SUPPLY — 77 items
ATTUNE PSFEM RTSZ4 NARCEM KNEE (Femur) IMPLANT
ATTUNE PSRP INSR SZ4 7 KNEE (Insert) IMPLANT
BASEPLATE TIBIAL ROTATING SZ 4 (Knees) IMPLANT
BATTERY INSTRU NAVIGATION (MISCELLANEOUS) ×4 IMPLANT
BIT DRILL QUICK REL 1/8 2PK SL (BIT) ×3 IMPLANT
BLADE CLIPPER SURG (BLADE) IMPLANT
BLADE SAW 70X12.5 (BLADE) ×1 IMPLANT
BLADE SAW 90X13X1.19 OSCILLAT (BLADE) ×1 IMPLANT
BLADE SAW 90X25X1.19 OSCILLAT (BLADE) ×1 IMPLANT
BONE CEMENT GENTAMICIN (Cement) ×2 IMPLANT
BRUSH SCRUB EZ PLAIN DRY (MISCELLANEOUS) ×1 IMPLANT
BSPLAT TIB 4 CMNT ROT PLAT STR (Knees) ×1 IMPLANT
BTRY SRG DRVR LF (MISCELLANEOUS) ×4
CEMENT BONE GENTAMICIN 40 (Cement) IMPLANT
COOLER POLAR GLACIER W/PUMP (MISCELLANEOUS) ×1 IMPLANT
CUFF TOURN SGL QUICK 24 (TOURNIQUET CUFF) ×1
CUFF TOURN SGL QUICK 30 (TOURNIQUET CUFF)
CUFF TRNQT CYL 24X4X16.5-23 (TOURNIQUET CUFF) IMPLANT
CUFF TRNQT CYL 30X4X21-28X (TOURNIQUET CUFF) IMPLANT
DRAPE 3/4 80X56 (DRAPES) ×1 IMPLANT
DRAPE INCISE IOBAN 66X45 STRL (DRAPES) IMPLANT
DRSG AQUACEL AG ADV 3.5X14 (GAUZE/BANDAGES/DRESSINGS) ×1 IMPLANT
DRSG DERMACEA NONADH 3X8 (GAUZE/BANDAGES/DRESSINGS) ×1 IMPLANT
DRSG MEPILEX SACRM 8.7X9.8 (GAUZE/BANDAGES/DRESSINGS) ×1 IMPLANT
DRSG TEGADERM 4X4.75 (GAUZE/BANDAGES/DRESSINGS) ×1 IMPLANT
DURAPREP 26ML APPLICATOR (WOUND CARE) ×2 IMPLANT
ELECT CAUTERY BLADE 6.4 (BLADE) ×1 IMPLANT
ELECT REM PT RETURN 9FT ADLT (ELECTROSURGICAL) ×1
ELECTRODE REM PT RTRN 9FT ADLT (ELECTROSURGICAL) ×1 IMPLANT
EX-PIN ORTHOLOCK NAV 4X150 (PIN) ×2 IMPLANT
GLOVE BIOGEL M STRL SZ7.5 (GLOVE) ×4 IMPLANT
GLOVE SRG 8 PF TXTR STRL LF DI (GLOVE) ×2 IMPLANT
GLOVE SURG UNDER POLY LF SZ8 (GLOVE) ×2
GOWN STRL REUS W/ TWL LRG LVL3 (GOWN DISPOSABLE) ×1 IMPLANT
GOWN STRL REUS W/ TWL XL LVL3 (GOWN DISPOSABLE) ×1 IMPLANT
GOWN STRL REUS W/TWL LRG LVL3 (GOWN DISPOSABLE) ×1
GOWN STRL REUS W/TWL XL LVL3 (GOWN DISPOSABLE) ×1
GOWN TOGA ZIPPER T7+ PEEL AWAY (MISCELLANEOUS) ×1 IMPLANT
HANDLE YANKAUER SUCT OPEN TIP (MISCELLANEOUS) ×1 IMPLANT
HEMOVAC 400CC 10FR (MISCELLANEOUS) ×1 IMPLANT
HOLDER FOLEY CATH W/STRAP (MISCELLANEOUS) ×1 IMPLANT
HOOD PEEL AWAY T7 (MISCELLANEOUS) ×1 IMPLANT
IV NS IRRIG 3000ML ARTHROMATIC (IV SOLUTION) ×1 IMPLANT
KIT TURNOVER KIT A (KITS) ×1 IMPLANT
KNIFE SCULPS 14X20 (INSTRUMENTS) ×1 IMPLANT
MANIFOLD NEPTUNE II (INSTRUMENTS) ×2 IMPLANT
NDL SPNL 20GX3.5 QUINCKE YW (NEEDLE) ×2 IMPLANT
NEEDLE SPNL 20GX3.5 QUINCKE YW (NEEDLE) ×2 IMPLANT
PACK TOTAL KNEE (MISCELLANEOUS) ×1 IMPLANT
PAD ABD DERMACEA PRESS 5X9 (GAUZE/BANDAGES/DRESSINGS) ×2 IMPLANT
PAD ARMBOARD 7.5X6 YLW CONV (MISCELLANEOUS) ×3 IMPLANT
PAD WRAPON POLAR KNEE (MISCELLANEOUS) ×1 IMPLANT
PATELLA MEDIAL ATTUN 35MM KNEE (Knees) IMPLANT
PENCIL SMOKE EVACUATOR COATED (MISCELLANEOUS) ×1 IMPLANT
PIN DRILL FIX HALF THREAD (BIT) ×2 IMPLANT
PIN FIXATION 1/8DIA X 3INL (PIN) ×1 IMPLANT
PULSAVAC PLUS IRRIG FAN TIP (DISPOSABLE) ×1
SOL PREP PVP 2OZ (MISCELLANEOUS) ×1
SOLUTION IRRIG SURGIPHOR (IV SOLUTION) ×1 IMPLANT
SOLUTION PREP PVP 2OZ (MISCELLANEOUS) ×1 IMPLANT
SPONGE DRAIN TRACH 4X4 STRL 2S (GAUZE/BANDAGES/DRESSINGS) ×1 IMPLANT
STAPLER SKIN PROX 35W (STAPLE) ×1 IMPLANT
STOCKINETTE IMPERV 14X48 (MISCELLANEOUS) ×1 IMPLANT
STRAP TIBIA SHORT (MISCELLANEOUS) ×1 IMPLANT
SUCTION TUBE FRAZIER 10FR DISP (SUCTIONS) ×1 IMPLANT
SUT VIC AB 0 CT1 36 (SUTURE) ×1 IMPLANT
SUT VIC AB 1 CT1 36 (SUTURE) ×2 IMPLANT
SUT VIC AB 2-0 CT2 27 (SUTURE) ×1 IMPLANT
SYR 30ML LL (SYRINGE) ×2 IMPLANT
TIP FAN IRRIG PULSAVAC PLUS (DISPOSABLE) ×1 IMPLANT
TOWEL OR 17X26 4PK STRL BLUE (TOWEL DISPOSABLE) IMPLANT
TOWER CARTRIDGE SMART MIX (DISPOSABLE) ×1 IMPLANT
TRAP FLUID SMOKE EVACUATOR (MISCELLANEOUS) ×1 IMPLANT
TRAY FOLEY MTR SLVR 16FR STAT (SET/KITS/TRAYS/PACK) ×1 IMPLANT
TUBING CONNECTING 10 (TUBING) ×2 IMPLANT
WATER STERILE IRR 1000ML POUR (IV SOLUTION) ×1 IMPLANT
WRAPON POLAR PAD KNEE (MISCELLANEOUS) ×1

## 2022-12-14 NOTE — Op Note (Signed)
OPERATIVE NOTE  DATE OF SURGERY:  12/14/2022  PATIENT NAME:  Bridget Howe   DOB: 04-Jan-1946  MRN: [id number]  PRE-OPERATIVE DIAGNOSIS: Degenerative arthrosis of the right knee, primary  POST-OPERATIVE DIAGNOSIS:  Same  PROCEDURE:  Right total knee arthroplasty using computer-assisted navigation  SURGEON:  Jena Gauss. M.D.  ASSISTANT:  Gean Birchwood, PA-C (present and scrubbed throughout the case, critical for assistance with exposure, retraction, instrumentation, and closure)  ANESTHESIA: spinal  ESTIMATED BLOOD LOSS: 50 mL  FLUIDS REPLACED: 1000 mL of crystalloid  TOURNIQUET TIME: 88 minutes  DRAINS: 2 medium Hemovac drains  SOFT TISSUE RELEASES: Anterior cruciate ligament, posterior cruciate ligament, deep medial collateral ligament, patellofemoral ligament  IMPLANTS UTILIZED: DePuy Attune size 4N posterior stabilized femoral component (cemented), size 4 rotating platform tibial component (cemented), 35 mm medialized dome patella (cemented), and a 7 mm stabilized rotating platform polyethylene insert.  INDICATIONS FOR SURGERY: Bridget Howe is a 77 y.o. year old female with a long history of progressive knee pain. X-rays demonstrated severe degenerative changes in tricompartmental fashion. The patient had not seen any significant improvement despite conservative nonsurgical intervention. After discussion of the risks and benefits of surgical intervention, the patient expressed understanding of the risks benefits and agree with plans for total knee arthroplasty.   The risks, benefits, and alternatives were discussed at length including but not limited to the risks of infection, bleeding, nerve injury, stiffness, blood clots, the need for revision surgery, cardiopulmonary complications, among others, and they were willing to proceed.  PROCEDURE IN DETAIL: The patient was brought into the operating room and, after adequate spinal anesthesia was achieved, a  tourniquet was placed on the patient's upper thigh. The patient's knee and leg were cleaned and prepped with alcohol and DuraPrep and draped in the usual sterile fashion. A "timeout" was performed as per usual protocol. The lower extremity was exsanguinated using an Esmarch, and the tourniquet was inflated to 300 mmHg. An anterior longitudinal incision was made followed by a standard mid vastus approach. The deep fibers of the medial collateral ligament were elevated in a subperiosteal fashion off of the medial flare of the tibia so as to maintain a continuous soft tissue sleeve. The patella was subluxed laterally and the patellofemoral ligament was incised. Inspection of the knee demonstrated severe degenerative changes with full-thickness loss of articular cartilage. Osteophytes were debrided using a rongeur. Anterior and posterior cruciate ligaments were excised. Two 4.0 mm Schanz pins were inserted in the femur and into the tibia for attachment of the array of trackers used for computer-assisted navigation. Hip center was identified using a circumduction technique. Distal landmarks were mapped using the computer. The distal femur and proximal tibia were mapped using the computer. The distal femoral cutting guide was positioned using computer-assisted navigation so as to achieve a 5 distal valgus cut. The femur was sized and it was felt that a size 4N femoral component was appropriate. A size 4 femoral cutting guide was positioned and the anterior cut was performed and verified using the computer. This was followed by completion of the posterior and chamfer cuts. Femoral cutting guide for the central box was then positioned in the center box cut was performed.  Attention was then directed to the proximal tibia. Medial and lateral menisci were excised. The extramedullary tibial cutting guide was positioned using computer-assisted navigation so as to achieve a 0 varus-valgus alignment and 3 posterior slope.  The cut was performed and verified using the computer. The proximal tibia  was sized and it was felt that a size 4 tibial tray was appropriate. Tibial and femoral trials were inserted followed by insertion of a 7 mm polyethylene insert. This allowed for excellent mediolateral soft tissue balancing both in flexion and in full extension. Finally, the patella was cut and prepared so as to accommodate a 35 mm medialized dome patella. A patella trial was placed and the knee was placed through a range of motion with excellent patellar tracking appreciated. The femoral trial was removed after debridement of posterior osteophytes. The central post-hole for the tibial component was reamed followed by insertion of a keel punch. Tibial trials were then removed. Cut surfaces of bone were irrigated with copious amounts of normal saline using pulsatile lavage and then suctioned dry. Polymethylmethacrylate cement with gentamicin was prepared in the usual fashion using a vacuum mixer. Cement was applied to the cut surface of the proximal tibia as well as along the undersurface of a size 4 rotating platform tibial component. Tibial component was positioned and impacted into place. Excess cement was removed using Personal assistant. Cement was then applied to the cut surfaces of the femur as well as along the posterior flanges of the size 4N femoral component. The femoral component was positioned and impacted into place. Excess cement was removed using Personal assistant. A 7 mm polyethylene trial was inserted and the knee was brought into full extension with steady axial compression applied. Finally, cement was applied to the backside of a 35 mm medialized dome patella and the patellar component was positioned and patellar clamp applied. Excess cement was removed using Personal assistant. After adequate curing of the cement, the tourniquet was deflated after a total tourniquet time of 88 minutes. Hemostasis was achieved using electrocautery.  The knee was irrigated with copious amounts of normal saline using pulsatile lavage followed by 450 ml of Surgiphor and then suctioned dry. 20 mL of 1.3% Exparel and 60 mL of 0.25% Marcaine in 40 mL of normal saline was injected along the posterior capsule, medial and lateral gutters, and along the arthrotomy site. A 7 mm stabilized rotating platform polyethylene insert was inserted and the knee was placed through a range of motion with excellent mediolateral soft tissue balancing appreciated and excellent patellar tracking noted. 2 medium drains were placed in the wound bed and brought out through separate stab incisions. The medial parapatellar portion of the incision was reapproximated using interrupted sutures of #1 Vicryl. Subcutaneous tissue was approximated in layers using first #0 Vicryl followed #2-0 Vicryl. The skin was approximated with skin staples. A sterile dressing was applied.  The patient tolerated the procedure well and was transported to the recovery room in stable condition.    Aeden Matranga P. Angie Fava., M.D.

## 2022-12-14 NOTE — Anesthesia Procedure Notes (Signed)
Spinal  Patient location during procedure: OR Start time: 12/14/2022 11:37 AM End time: 12/14/2022 11:43 AM Reason for block: surgical anesthesia Staffing Resident/CRNA: Lynden Oxford, CRNA Performed by: Lynden Oxford, CRNA Authorized by: Lynden Oxford, CRNA   Preanesthetic Checklist Completed: patient identified, IV checked, site marked, risks and benefits discussed, surgical consent, monitors and equipment checked and pre-op evaluation Spinal Block Patient position: sitting Prep: DuraPrep Patient monitoring: heart rate, cardiac monitor, continuous pulse ox and blood pressure Approach: midline Location: L3-4 Injection technique: single-shot Needle Needle type: Pencan  Needle gauge: 24 G Needle length: 9 cm Assessment Sensory level: T4 Events: CSF return

## 2022-12-14 NOTE — Progress Notes (Signed)
Pts BP 92/58, pt asymptomatic, MD Hooten notified, and made aware. No orders received.

## 2022-12-14 NOTE — Interval H&P Note (Signed)
History and Physical Interval Note:  12/14/2022 11:02 AM  Bridget Howe  has presented today for surgery, with the diagnosis of PRIMARY OSTEOARTHRITIS OF RIGHT KNEE..  The various methods of treatment have been discussed with the patient and family. After consideration of risks, benefits and other options for treatment, the patient has consented to  Procedure(s): COMPUTER ASSISTED TOTAL KNEE ARTHROPLASTY (Right) as a surgical intervention.  The patient's history has been reviewed, patient examined, no change in status, stable for surgery.  I have reviewed the patient's chart and labs.  Questions were answered to the patient's satisfaction.     Shawnell Dykes P Saida Lonon

## 2022-12-14 NOTE — Anesthesia Postprocedure Evaluation (Signed)
Anesthesia Post Note  Patient: Bridget Howe  Procedure(s) Performed: COMPUTER ASSISTED TOTAL KNEE ARTHROPLASTY (Right: Knee)  Patient location during evaluation: PACU Anesthesia Type: Spinal Level of consciousness: awake and alert, oriented and patient cooperative Pain management: pain level controlled Vital Signs Assessment: post-procedure vital signs reviewed and stable Respiratory status: spontaneous breathing, nonlabored ventilation and respiratory function stable Cardiovascular status: blood pressure returned to baseline and stable Postop Assessment: adequate PO intake, no headache, no backache and spinal receding Anesthetic complications: no   No notable events documented.   Last Vitals:  Vitals:   12/14/22 1630 12/14/22 1642  BP: (!) 92/55 (!) 92/58  Pulse: (!) 52 (!) 51  Resp: 10 16  Temp:    SpO2: 97% 97%    Last Pain:  Vitals:   12/14/22 1659  TempSrc:   PainSc: 0-No pain                 Reed Breech

## 2022-12-14 NOTE — Anesthesia Preprocedure Evaluation (Signed)
Anesthesia Evaluation  Patient identified by MRN, date of birth, ID band Patient awake    Reviewed: Allergy & Precautions, NPO status , Patient's Chart, lab work & pertinent test results  History of Anesthesia Complications (+) POST - OP SPINAL HEADACHE and history of anesthetic complications  Airway Mallampati: III  TM Distance: <3 FB Neck ROM: limited    Dental  (+) Chipped   Pulmonary asthma , sleep apnea , former smoker   Pulmonary exam normal        Cardiovascular Exercise Tolerance: Good (-) angina + CAD and + Past MI  Normal cardiovascular exam+ dysrhythmias      Neuro/Psych  Neuromuscular disease CVA  negative psych ROS   GI/Hepatic negative GI ROS, Neg liver ROS,neg GERD  ,,  Endo/Other  Hypothyroidism    Renal/GU      Musculoskeletal   Abdominal   Peds  Hematology negative hematology ROS (+)   Anesthesia Other Findings Past Medical History: No date: Actinic keratosis No date: Arthritis No date: Asthma No date: Complication of anesthesia     Comment:  a.) egg white/yolk allergy - unable to tolerate propofol No date: Complication of anesthesia     Comment:  limited ROM cervical No date: Coronary artery disease No date: COVID-19 No date: Dysrhythmia No date: Hemorrhoids No date: Hypoglycemia No date: Hypothyroidism No date: Myocardial infarction [facility])     Comment:  anterior wall MI at 77 yr old No date: OSA treated with BiPAP 10/08/2014: Prediabetes No date: Primary osteoarthritis of right knee 09/05/2007: Squamous cell carcinoma of skin     Comment:  Right dorsal hand No date: Squamous cell carcinoma of skin     Comment:  L medial canthus, treated around 1992 No date: Stroke [facility])     Comment:  scan shows possible mini strokes  Past Surgical History: No date: BREAST CYST ASPIRATION; Bilateral     Comment:  3 on right, 2 on the left No date: CARDIAC CATHETERIZATION No date: CARPAL TUNNEL  RELEASE; Bilateral 05/10/2022: CATARACT EXTRACTION W/PHACO; Left     Comment:  Procedure: CATARACT EXTRACTION PHACO AND INTRAOCULAR               LENS PLACEMENT (IOC) LEFT  CLAREON VIVITY LENS  7.00                00:38.9;  Surgeon: Galen Manila, MD;  Location:               [facility];  Service: Ophthalmology;                Laterality: Left; 05/24/2022: CATARACT EXTRACTION W/PHACO; Right     Comment:  Procedure: CATARACT EXTRACTION PHACO AND INTRAOCULAR               LENS PLACEMENT (IOC) RIGHT CLAREON VIVITY 6.22 00:40.2;                Surgeon: Galen Manila, MD;  Location: [facility];  Service: Ophthalmology;  Laterality: Right; 03/09/2016: COLONOSCOPY WITH PROPOFOL; N/A     Comment:  Procedure: COLONOSCOPY WITH PROPOFOL;  Surgeon: Scot Jun, MD;  Location: [facility] ENDOSCOPY;  Service:               Endoscopy;  Laterality: N/A; 03/09/2016: ESOPHAGOGASTRODUODENOSCOPY (EGD) WITH PROPOFOL; N/A     Comment:  Procedure:  ESOPHAGOGASTRODUODENOSCOPY (EGD) WITH               PROPOFOL;  Surgeon: Scot Jun, MD;  Location: [facility]              ENDOSCOPY;  Service: Endoscopy;  Laterality: N/A; No date: KNEE ARTHROSCOPY; Right No date: MENISCUS REPAIR; Right No date: NECK SURGERY No date: TENDON REPAIR; Left     Comment:  wrist No date: TONSILLECTOMY No date: TRIGGER FINGER RELEASE; Right  BMI    Body Mass Index: 28.43 kg/m      Reproductive/Obstetrics negative OB ROS                             Anesthesia Physical Anesthesia Plan  ASA: 3  Anesthesia Plan: Spinal   Post-op Pain Management:    Induction:   PONV Risk Score and Plan:   Airway Management Planned: Natural Airway and Nasal Cannula  Additional Equipment:   Intra-op Plan:   Post-operative Plan:   Informed Consent: I have reviewed the patients History and Physical, chart, labs and discussed the procedure including the risks,  benefits and alternatives for the proposed anesthesia with the patient or authorized representative who has indicated his/her understanding and acceptance.     Dental Advisory Given  Plan Discussed with: Anesthesiologist, CRNA and Surgeon  Anesthesia Plan Comments: (Patient would like [place] to avoid propofol which we plan to do.  Patient reports no bleeding problems and no anticoagulant use.  Plan for spinal with backup GA  Patient consented for risks of anesthesia including but not limited to:  - adverse reactions to medications - damage to eyes, teeth, lips or other oral mucosa - nerve damage due to positioning  - risk of bleeding, infection and or nerve damage from spinal that could lead to paralysis - risk of headache or failed spinal - damage to teeth, lips or other oral mucosa - sore throat or hoarseness - damage to heart, brain, nerves, lungs, other parts of body or loss of life  Patient voiced understanding.)       Anesthesia Quick Evaluation

## 2022-12-14 NOTE — Transfer of Care (Signed)
Immediate Anesthesia Transfer of Care Note  Patient: Bridget Howe  Procedure(s) Performed: COMPUTER ASSISTED TOTAL KNEE ARTHROPLASTY (Right: Knee)  Patient Location: PACU  Anesthesia Type:Spinal  Level of Consciousness: drowsy  Airway & Oxygen Therapy: Patient Spontanous Breathing  Post-op Assessment: Report given to RN and Post -op Vital signs reviewed and stable  Post vital signs: Reviewed and stable  Last Vitals:  Vitals Value Taken Time  BP 116/59 12/14/22 1516  Temp    Pulse 55 12/14/22 1521  Resp 13 12/14/22 1521  SpO2 99 % 12/14/22 1521  Vitals shown include unfiled device data.  Last Pain:  Vitals:   12/14/22 0853  TempSrc: Temporal  PainSc: 0-No pain         Complications: No notable events documented.

## 2022-12-15 ENCOUNTER — Encounter: Payer: Self-pay | Admitting: Orthopedic Surgery

## 2022-12-15 DIAGNOSIS — M1711 Unilateral primary osteoarthritis, right knee: Secondary | ICD-10-CM | POA: Diagnosis not present

## 2022-12-15 MED ORDER — ASPIRIN 81 MG PO CHEW
CHEWABLE_TABLET | ORAL | Status: AC
  Filled 2022-12-15: qty 1

## 2022-12-15 MED ORDER — FERROUS SULFATE 325 (65 FE) MG PO TABS
ORAL_TABLET | ORAL | Status: AC
  Filled 2022-12-15: qty 1

## 2022-12-15 MED ORDER — CELECOXIB 200 MG PO CAPS: 200.0000 mg | ORAL_CAPSULE | Freq: Two times a day (BID) | ORAL | 1 refills | Status: DC

## 2022-12-15 MED ORDER — MAGNESIUM HYDROXIDE 400 MG/5ML PO SUSP
ORAL | Status: AC
  Filled 2022-12-15: qty 30

## 2022-12-15 MED ORDER — ASPIRIN EC 81 MG PO TBEC: 81.0000 mg | DELAYED_RELEASE_TABLET | Freq: Two times a day (BID) | ORAL | Status: AC

## 2022-12-15 MED ORDER — SENNOSIDES-DOCUSATE SODIUM 8.6-50 MG PO TABS
ORAL_TABLET | ORAL | Status: AC
  Filled 2022-12-15: qty 1

## 2022-12-15 MED ORDER — HYDROCODONE-ACETAMINOPHEN 5-325 MG PO TABS: 1.0000 | ORAL_TABLET | ORAL | 0 refills | Status: DC | PRN

## 2022-12-15 MED ORDER — CELECOXIB 200 MG PO CAPS
ORAL_CAPSULE | ORAL | Status: AC
  Filled 2022-12-15: qty 1

## 2022-12-15 MED ORDER — ACETAMINOPHEN 10 MG/ML IV SOLN
INTRAVENOUS | Status: AC
  Filled 2022-12-15: qty 100

## 2022-12-15 MED ORDER — METOCLOPRAMIDE HCL 10 MG PO TABS
ORAL_TABLET | ORAL | Status: AC
  Filled 2022-12-15: qty 1

## 2022-12-15 MED ORDER — TRAMADOL HCL 50 MG PO TABS: 50.0000 mg | ORAL_TABLET | ORAL | 0 refills | Status: AC | PRN

## 2022-12-15 NOTE — Progress Notes (Addendum)
Subjective: 1 Day Post-Op Procedure(s) (LRB): COMPUTER ASSISTED TOTAL KNEE ARTHROPLASTY (Right) Patient reports pain as 0 on 0-10 scale.   Patient seen in rounds with Dr. Ernest Pine. Patient is well, and has had no acute complaints or problems. Denies any SOB, CP, fevers, chills, N/V.  We will start therapy today.  Plan is to go Home after hospital stay.  Objective: Vital signs in last 24 hours: Temp:  [96.8 F (36 C)-98 F (36.7 C)] 97.8 F (36.6 C) (08/15 0623) Pulse Rate:  [50-60] 56 (08/15 0623) Resp:  [7-19] 16 (08/15 0623) BP: (92-168)/(49-77) 118/77 (08/15 0623) SpO2:  [88 %-100 %] 97 % (08/15 0623) Weight:  [70.5 kg] 70.5 kg (08/14 0853)  Intake/Output from previous day:  Intake/Output Summary (Last 24 hours) at 12/15/2022 0751 Last data filed at 12/14/2022 2239 Gross per 24 hour  Intake 1657.02 ml  Output 1230 ml  Net 427.02 ml    Intake/Output this shift: No intake/output data recorded.  Labs: No results for input(s): "HGB" in the last 72 hours. No results for input(s): "WBC", "RBC", "HCT", "PLT" in the last 72 hours. No results for input(s): "NA", "K", "CL", "CO2", "BUN", "CREATININE", "GLUCOSE", "CALCIUM" in the last 72 hours. No results for input(s): "LABPT", "INR" in the last 72 hours.  EXAM General - Patient is Alert, Appropriate, and Oriented Extremity - Neurologically intact ABD soft Neurovascular intact Sensation intact distally Intact pulses distally Dorsiflexion/Plantar flexion intact No cellulitis present Compartment soft Dressing - dressing C/D/I and scant drainage along the inferior aspect Motor Function - intact, moving foot and toes well on exam.  Patient able to plantar and dorsiflex with good strength and range of motion.  Patient able to straight leg raise with no difficulty, good strength.  Patient is neurovascularly intact to all dermatomes down her right lower extremity.  Posterior tibial pulses appreciated 2+. JP Drain pulled without  difficulty. Intact  Past Medical History:  Diagnosis Date   Actinic keratosis    Arthritis    Asthma    Complication of anesthesia    a.) egg white/yolk allergy - unable to tolerate propofol   Complication of anesthesia    limited ROM cervical   Coronary artery disease    COVID-19    Dysrhythmia    Hemorrhoids    Hypoglycemia    Hypothyroidism    Myocardial infarction (HCC)    anterior wall MI at 77 yr old   OSA treated with BiPAP    Prediabetes 10/08/2014   Primary osteoarthritis of right knee    Squamous cell carcinoma of skin 09/05/2007   Right dorsal hand   Squamous cell carcinoma of skin    L medial canthus, treated around 1992   Stroke [facility])    scan shows possible mini strokes    Assessment/Plan: 1 Day Post-Op Procedure(s) (LRB): COMPUTER ASSISTED TOTAL KNEE ARTHROPLASTY (Right) Principal Problem:   Total knee replacement status  Estimated body mass index is 28.43 kg/m as calculated from the following:   Height as of this encounter: 5\' 2"  (1.575 m).   Weight as of this encounter: 70.5 kg. Advance diet Up with therapy  Patient will continue to work with physical therapy to pass postoperative PT protocols, ROM and strengthening  Discussed with the patient continuing to utilize Polar Care  Patient will use bone foam in 20-30 minute intervals  Patient will wear TED hose bilaterally to help prevent DVT and clot formation  Discussed the Aquacel bandage.  This bandage will stay in place 7 days  postoperatively.  Can be replaced with honeycomb bandages that will be sent home with the patient  Discussed sending the patient home with tramadol and Norco.  Patient states that she tolerates Norco better than she does oxycodone.  Discussed not exceeding daily Tylenol recommendations while utilizing Norco.  For as needed pain management.  Patient will also be sent home with Celebrex to help with swelling and inflammation.  Patient will take an 81 mg aspirin twice daily for  DVT prophylaxis  JP removed without difficulty, intact  Weight-Bearing as tolerated to  leg  Patient will follow-up with [facility] clinic orthopedics in 2 weeks for staple removal and reevaluation  Rayburn Go, PA-C [facility] 12/15/2022, 7:51 AM

## 2022-12-15 NOTE — Discharge Summary (Signed)
Physician Discharge Summary  Subjective: 1 Day Post-Op Procedure(s) (LRB): COMPUTER ASSISTED TOTAL KNEE ARTHROPLASTY (Right) Patient reports pain as 0 on 0-10 scale.   Patient seen in rounds with Dr. Ernest Pine. Patient is well, and has had no acute complaints or problems. Denies any SOB, CP, fevers, chills, N/V.  Patient was able to work with PT today and pass her PT protocols postop day 1 Patient is ready to go home  Physician Discharge Summary  Patient ID: Bridget Howe: [id number] DOB/AGE: [birth year] 77 y.o.  Admit date: 12/14/2022 Discharge date: 12/15/2022  Admission Diagnoses:  Discharge Diagnoses:  Principal Problem:   Total knee replacement status   Discharged Condition: good  Hospital Course: Patient presented to the hospital on 12/14/2022 for an elective right total knee arthroplasty performed by Dr. Ernest Pine.  The patient was given 1 g of TXA and 2 g of Ancef perioperatively.  The patient tolerated the surgery well without any complications.  See procedure note for details below.  Postoperatively, the patient did well, she had minimal drainage and her JP drain was pulled postoperatively day 1.  She was able to get up and work with PT on postop day 1, and passed all of her PT protocols.  Patient's vital signs are stable.  Denies any chest pain, shortness of breath, fevers, chills or N/V.  Patient is stable for discharge  PROCEDURE:  Right total knee arthroplasty using computer-assisted navigation   SURGEON:  Jena Gauss. M.D.   ASSISTANT:  Gean Birchwood, PA-C (present and scrubbed throughout the case, critical for assistance with exposure, retraction, instrumentation, and closure)   ANESTHESIA: spinal   ESTIMATED BLOOD LOSS: 50 mL   FLUIDS REPLACED: 1000 mL of crystalloid   TOURNIQUET TIME: 88 minutes   DRAINS: 2 medium Hemovac drains   SOFT TISSUE RELEASES: Anterior cruciate ligament, posterior cruciate ligament, deep medial collateral ligament,  patellofemoral ligament   IMPLANTS UTILIZED: DePuy Attune size 4N posterior stabilized femoral component (cemented), size 4 rotating platform tibial component (cemented), 35 mm medialized dome patella (cemented), and a 7 mm stabilized rotating platform polyethylene insert.  Treatments: None  Discharge Exam: Blood pressure 118/77, pulse (!) 56, temperature 97.8 F (36.6 C), resp. rate 16, height 5\' 2"  (1.575 m), weight 70.5 kg, SpO2 97%.   Disposition: Home   Allergies as of 12/15/2022       Reactions   Influenza Vaccines Anaphylaxis   Caffeine    Copper-containing Compounds    Covid-19 (subunit) Vaccine Other (See Comments)   Chest pain   Egg-derived Products Swelling   Nexium [esomeprazole] Cough   Joint pain   Nickel    Propofol Other (See Comments)   Patient prefers to avoid due to egg allergy   Pseudoephedrine Hcl    Ranitidine Hcl Cough   Joint pain   Tacrolimus         Medication List     TAKE these medications    acetaminophen 500 MG tablet Commonly known as: TYLENOL Take 500 mg by mouth every 4 (four) hours as needed.   aspirin EC 81 MG tablet Take 1 tablet (81 mg total) by mouth in the morning and at bedtime. What changed: when to take this   b complex vitamins capsule Take 1 capsule by mouth daily.   BENEFIBER DRINK MIX PO Take 2 Scoops by mouth at bedtime.   BENGAY EX Apply topically as needed.   celecoxib 200 MG capsule Commonly known as: CELEBREX Take 1 capsule (200 mg total)  by mouth 2 (two) times daily.   cholecalciferol 25 MCG (1000 UNIT) tablet Commonly known as: VITAMIN D3 Take 1,000 Units by mouth daily.   diclofenac sodium 1 % Gel Commonly known as: VOLTAREN Apply 4 g topically 4 (four) times daily.   diphenhydrAMINE 25 MG tablet Commonly known as: BENADRYL Take 25 mg by mouth at bedtime as needed. Take 1/2 tablet 12.5 mg   Estroven Complete 4 MG Tabs Generic drug: Rhubarb Take by mouth daily.   fluticasone 50 MCG/ACT  nasal spray Commonly known as: FLONASE Place into both nostrils daily.   HYDROcodone-acetaminophen 5-325 MG tablet Commonly known as: Norco Take 1 tablet by mouth every 4 (four) hours as needed for moderate pain.   levothyroxine 50 MCG tablet Commonly known as: SYNTHROID Take 50 mcg by mouth daily before breakfast.   OMEGA-3 PO Take 1 tablet by mouth daily.   OVER THE COUNTER MEDICATION 2 tablets daily. Fiber Well gummie   PYCNOGENOL PLUS PO Take by mouth.   traMADol 50 MG tablet Commonly known as: ULTRAM Take 1-2 tablets (50-100 mg total) by mouth every 4 (four) hours as needed for moderate pain.               Durable Medical Equipment  (From admission, onward)           Start     Ordered   12/14/22 1638  DME Walker rolling  Once       Question:  Patient needs a walker to treat with the following condition  Answer:  Total knee replacement status   12/14/22 1637   12/14/22 1638  DME Bedside commode  Once       Comments: Patient is not able to walk the distance required to go the bathroom, or he/she is unable to safely negotiate stairs required to access the bathroom.  A 3in1 BSC will alleviate this problem  Question:  Patient needs a bedside commode to treat with the following condition  Answer:  Total knee replacement status   12/14/22 1637            Follow-up Information     Rayburn Go, PA-C Follow up on 12/29/2022.   Specialty: Orthopedic Surgery Why: at 9:45am Contact information: [address] [place] [address] [phone]         Donato Heinz, MD Follow up on 02/02/2023.   Specialty: Orthopedic Surgery Why: at 2:30pm Contact information: [address] [facility] [place] [address] [phone]                 Signed: Gean Birchwood 12/15/2022, 8:38 AM   Objective: Vital signs in last 24 hours: Temp:  [96.8 F (36 C)-98 F (36.7 C)] 97.8 F (36.6 C) (08/15 0623) Pulse Rate:   [50-60] 56 (08/15 0623) Resp:  [7-19] 16 (08/15 0623) BP: (92-168)/(49-77) 118/77 (08/15 0623) SpO2:  [88 %-100 %] 97 % (08/15 0623) Weight:  [70.5 kg] 70.5 kg (08/14 0853)  Intake/Output from previous day:  Intake/Output Summary (Last 24 hours) at 12/15/2022 0838 Last data filed at 12/14/2022 2239 Gross per 24 hour  Intake 1657.02 ml  Output 1230 ml  Net 427.02 ml    Intake/Output this shift: No intake/output data recorded.  Labs: No results for input(s): "HGB" in the last 72 hours. No results for input(s): "WBC", "RBC", "HCT", "PLT" in the last 72 hours. No results for input(s): "NA", "K", "CL", "CO2", "BUN", "CREATININE", "GLUCOSE", "CALCIUM" in the last 72 hours. No  results for input(s): "LABPT", "INR" in the last 72 hours.  EXAM: General - Patient is Alert, Appropriate, and Oriented Extremity - Neurologically intact ABD soft Neurovascular intact Sensation intact distally Intact pulses distally Dorsiflexion/Plantar flexion intact No cellulitis present Compartment soft Dressing - dressing C/D/I and scant drainage along the inferior aspect Motor Function - intact, moving foot and toes well on exam.  Patient able to plantar and dorsiflex with good strength and range of motion.  Patient able to straight leg raise with no difficulty, good strength.  Patient is neurovascularly intact to all dermatomes down her right lower extremity.  Posterior tibial pulses appreciated 2+. JP Drain pulled without difficulty. 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   Complication of anesthesia    limited ROM cervical   Coronary artery disease    COVID-19    Dysrhythmia     Hemorrhoids    Hypoglycemia    Hypothyroidism    Myocardial infarction (HCC)    anterior wall MI at 77 yr old   OSA treated with BiPAP    Prediabetes 10/08/2014   Primary osteoarthritis of right knee    Squamous cell carcinoma of skin 09/05/2007   Right dorsal hand   Squamous cell carcinoma of skin    L medial canthus, treated around 1992   Stroke [facility])    scan shows possible mini strokes   Principal Problem:   Total knee replacement status  Estimated body mass index is 28.43 kg/m as calculated from the following:   Height as of this encounter: 5\' 2"  (1.575 m).   Weight as of this encounter: 70.5 kg.  Patient has passed all of her PT protocols and is stable for discharge.  Will look to transition to home health physical therapy.  Continue to work on ambulation, gait, strength and ROM training.  Look to transition to outpatient physical therapy at 2-week mark.  Discussed with the patient continuing to utilize Polar Care   Patient will use bone foam in 20-30 minute intervals   Patient will wear TED hose bilaterally to help prevent DVT and clot formation   Discussed the Aquacel bandage.  This bandage will stay in place 7 days postoperatively.  Can be replaced with honeycomb bandages that will be sent home with the patient   Discussed sending the patient home with tramadol and Norco for as needed pain management.  Patient states that she has done better with Norco in the past as opposed to oxycodone.  Discussed not taking or exceeding Tylenol daily recommendations with utilizing Norco.  Patient will also be sent home with Celebrex to help with swelling and inflammation.  Patient will take an 81 mg aspirin twice daily for DVT prophylaxis   JP removed without difficulty, intact   Weight-Bearing as tolerated to  leg   Patient will follow-up with [facility] orthopedics in 2 weeks for staple removal and reevaluation  Diet - Regular diet Follow up - in 2 weeks Activity -  WBAT Disposition - Home Condition Upon Discharge - Good DVT Prophylaxis - Aspirin and TED hose  Danise Edge, PA-C Orthopaedic Surgery 12/15/2022, 8:38 AM

## 2022-12-15 NOTE — Evaluation (Signed)
Occupational Therapy Evaluation Patient Details Name: Bridget Howe MRN: [id number] DOB: 19-Sep-1945 Today's Date: 12/15/2022   History of Present Illness Pt is a 77 year old female s/p R total knee arthroplasty 12/14/22;   Clinical Impression   Pt seen for OT evaluation this date, POD#1 from above surgery. PTA pt is indep in ADL/IADL, amb with no AD; Pt is eager to return to PLOF with less pain and improved safety and independence. Pt provided education via demo and hand out in polar care mgt, falls prevention strategies, home/routines modifications, DME/AE for LB bathing and dressing tasks, and compression stocking mgt. Pt would benefit from skilled OT services including additional instruction in dressing techniques with or without assistive devices for dressing and bathing skills to support recall and carryover prior to discharge and ultimately to maximize safety, independence, and minimize falls risk and caregiver burden. Pt is left in bedside chair, all needs met.     If plan is discharge home, recommend the following: A little help with bathing/dressing/bathroom;Help with stairs or ramp for entrance;Assist for transportation;Assistance with cooking/housework    Functional Status Assessment  Patient has had a recent decline in their functional status and demonstrates the ability to make significant improvements in function in a reasonable and predictable amount of time.  Equipment Recommendations  BSC/3in1;Other (comment) (pt reports she does not want bsc after discussing recommendation and home set up)    Recommendations for Other Services       Precautions / Restrictions Precautions Precautions: Fall Restrictions Weight Bearing Restrictions: Yes RLE Weight Bearing: Weight bearing as tolerated      Mobility Bed Mobility Overal bed mobility: Needs Assistance Bed Mobility: Supine to Sit     Supine to sit: Supervision, HOB elevated          Transfers Overall  transfer level: Needs assistance Equipment used: Rolling walker (2 wheels) Transfers: Sit to/from Stand Sit to Stand: Supervision           General transfer comment: intermittent vcs for RW technique      Balance Overall balance assessment: Needs assistance Sitting-balance support: Feet supported Sitting balance-Leahy Scale: Normal     Standing balance support: Bilateral upper extremity supported, During functional activity, Reliant on assistive device for balance Standing balance-Leahy Scale: Good                             ADL either performed or assessed with clinical judgement   ADL Overall ADL's : Needs assistance/impaired Eating/Feeding: Sitting;Set up   Grooming: Wash/dry hands;Supervision/safety;Standing Grooming Details (indicate cue type and reason): sink level with RW         Upper Body Dressing : Set up;Sitting   Lower Body Dressing: Supervision/safety;Sit to/from stand Lower Body Dressing Details (indicate cue type and reason): underwear, shorts, socks, shoes intermittent vcs for compensatory strategies; MOD A donning R compression stocking Toilet Transfer: Supervision/safety;Rolling walker (2 wheels);BSC/3in1   Toileting- Clothing Manipulation and Hygiene: Supervision/safety;Sitting/lateral lean       Functional mobility during ADLs: Supervision/safety;Rolling walker (2 wheels) (household distances)       Vision Patient Visual Report: No change from baseline              Pertinent Vitals/Pain Pain Assessment Pain Assessment: 0-10 Pain Score: 2  Pain Location: R knee Pain Descriptors / Indicators: Discomfort Pain Intervention(s): Monitored during session, Repositioned     Extremity/Trunk Assessment Upper Extremity Assessment Upper Extremity Assessment: Defer to OT evaluation  Lower Extremity Assessment Lower Extremity Assessment: RLE deficits/detail RLE Deficits / Details: s/p R TKR RLE Sensation: WNL   Cervical / Trunk  Assessment Cervical / Trunk Assessment: Normal   Communication Communication Communication: No apparent difficulties Cueing Techniques: Verbal cues   Cognition Arousal: Alert Behavior During Therapy: WFL for tasks assessed/performed Overall Cognitive Status: Within Functional Limits for tasks assessed                                       General Comments  vss throughout, incision intact pre/post session            Home Living Family/patient expects to be discharged to:: Private residence Living Arrangements: Spouse/significant other Available Help at Discharge: Family;Available 24 hours/day Type of Home: House Home Access: Level entry Entrance Stairs-Number of Steps: small lip to get in the door   Home Layout: One level     Bathroom Shower/Tub: Producer, television/film/video: Handicapped height Bathroom Accessibility: Yes   Home Equipment: Agricultural consultant (2 wheels);Cane - single point;Grab bars - tub/shower          Prior Functioning/Environment Prior Level of Function : Independent/Modified Independent;Driving             Mobility Comments: patient fully independent at baseline, takes care of 5 grandhcildren. ADLs Comments: independent        OT Problem List: Decreased activity tolerance;Impaired balance (sitting and/or standing)      OT Treatment/Interventions: Self-care/ADL training;DME and/or AE instruction;Balance training;Therapeutic activities;Patient/family education    OT Goals(Current goals can be found in the care plan section) Acute Rehab OT Goals Patient Stated Goal: go home OT Goal Formulation: With patient Time For Goal Achievement: 12/29/22 Potential to Achieve Goals: Good ADL Goals Pt Will Perform Lower Body Dressing: with modified independence Pt Will Transfer to Toilet: with modified independence;ambulating;regular height toilet Pt Will Perform Toileting - Clothing Manipulation and hygiene: with modified  independence;sit to/from stand  OT Frequency: Min 1X/week    Co-evaluation              AM-PAC OT "6 Clicks" Daily Activity     Outcome Measure Help from another person eating meals?: None Help from another person taking care of personal grooming?: None Help from another person toileting, which includes using toliet, bedpan, or urinal?: None Help from another person bathing (including washing, rinsing, drying)?: A Little Help from another person to put on and taking off regular upper body clothing?: None Help from another person to put on and taking off regular lower body clothing?: A Little 6 Click Score: 22   End of Session Equipment Utilized During Treatment: Rolling walker (2 wheels) Nurse Communication: Mobility status  Activity Tolerance: Patient tolerated treatment well Patient left: in chair;with call bell/phone within reach  OT Visit Diagnosis: Other abnormalities of gait and mobility (R26.89)                Time: 9528-4132 OT Time Calculation (min): 27 min Charges:  OT General Charges $OT Visit: 1 Visit OT Evaluation $OT Eval Low Complexity: 1 Low  Oleta Mouse, OTD OTR/L  12/15/22, 9:58 AM

## 2022-12-15 NOTE — Evaluation (Signed)
Physical Therapy Evaluation Patient Details Name: Bridget Howe MRN: [id number] DOB: 04-Nov-1945 Today's Date: 12/15/2022  History of Present Illness  Pt is a 77 year old female s/p R total knee arthroplasty 12/14/22;  Clinical Impression  Patient received in recliner, dressed, motivated to return home. She is pleasant and talkative throughout session. Reports minimal pain. She requires supervision for transfers, ambulation of 300 feet with RW and up/down 4 steps with B rails. Reviewed TKA exercises and precautions with patient and gave patient handout. She will continue to benefit from skilled PT to improve strength, ROM, functional independence and safety.           If plan is discharge home, recommend the following: A little help with walking and/or transfers;A little help with bathing/dressing/bathroom;Assist for transportation;Help with stairs or ramp for entrance;Assistance with cooking/housework   Can travel by private vehicle        Equipment Recommendations None recommended by PT  Recommendations for Other Services       Functional Status Assessment Patient has had a recent decline in their functional status and demonstrates the ability to make significant improvements in function in a reasonable and predictable amount of time.     Precautions / Restrictions Precautions Precautions: Fall Restrictions Weight Bearing Restrictions: Yes RLE Weight Bearing: Weight bearing as tolerated      Mobility  Bed Mobility               General bed mobility comments: NT patient received in recliner    Transfers Overall transfer level: Needs assistance Equipment used: Rolling walker (2 wheels) Transfers: Sit to/from Stand Sit to Stand: Supervision           General transfer comment: intermittent vcs for RW technique    Ambulation/Gait Ambulation/Gait assistance: Supervision Gait Distance (Feet): 300 Feet Assistive device: Rolling walker (2 wheels) Gait  Pattern/deviations: Step-through pattern Gait velocity: WFL     General Gait Details: cues for walker proximity  Stairs Stairs: Yes Stairs assistance: Supervision Stair Management: Two rails, Step to pattern, Forwards Number of Stairs: 4 General stair comments: safe on steps  Wheelchair Mobility     Tilt Bed    Modified Rankin (Stroke Patients Only)       Balance Overall balance assessment: Modified Independent Sitting-balance support: Feet supported Sitting balance-Leahy Scale: Normal     Standing balance support: Bilateral upper extremity supported, During functional activity, Reliant on assistive device for balance Standing balance-Leahy Scale: Good Standing balance comment: static standing good without UE support                             Pertinent Vitals/Pain Pain Assessment Pain Assessment: 0-10 Pain Score: 2  Faces Pain Scale: Hurts a little bit Pain Location: R knee Pain Descriptors / Indicators: Discomfort, Sore Pain Intervention(s): Monitored during session, Premedicated before session, Repositioned, Ice applied    Home Living Family/patient expects to be discharged to:: Private residence Living Arrangements: Spouse/significant other Available Help at Discharge: Family;Available 24 hours/day Type of Home: House Home Access: Level entry   Entrance Stairs-Number of Steps: small lip to get in the door   Home Layout: One level Home Equipment: Agricultural consultant (2 wheels);Cane - single point;Grab bars - tub/shower      Prior Function Prior Level of Function : Independent/Modified Independent;Driving             Mobility Comments: patient fully independent at baseline, takes care of 5 grandhcildren. ADLs  Comments: independent     Extremity/Trunk Assessment   Upper Extremity Assessment Upper Extremity Assessment: Defer to OT evaluation    Lower Extremity Assessment Lower Extremity Assessment: RLE deficits/detail RLE Deficits /  Details: s/p R TKR RLE Sensation: WNL    Cervical / Trunk Assessment Cervical / Trunk Assessment: Normal  Communication   Communication Communication: No apparent difficulties Cueing Techniques: Verbal cues  Cognition Arousal: Alert Behavior During Therapy: WFL for tasks assessed/performed Overall Cognitive Status: Within Functional Limits for tasks assessed                                 General Comments: talkative throughout session        General Comments General comments (skin integrity, edema, etc.): vss throughout, incision intact pre/post session    Exercises Total Joint Exercises Ankle Circles/Pumps: AROM, Right, 5 reps Quad Sets: AROM, Right, 5 reps Short Arc Quad: AROM, Right, 5 reps Heel Slides: AROM, Right, 5 reps Hip ABduction/ADduction: AROM, Right, 5 reps Straight Leg Raises: AROM, Right, 5 reps Long Arc Quad: AROM, Right, 5 reps Goniometric ROM: grossly 0-110 R knee   Assessment/Plan    PT Assessment Patient needs continued PT services  PT Problem List Decreased strength;Pain;Decreased mobility;Decreased knowledge of use of DME;Decreased knowledge of precautions;Decreased skin integrity;Decreased activity tolerance;Decreased range of motion       PT Treatment Interventions DME instruction;Gait training;Stair training;Functional mobility training;Therapeutic activities;Therapeutic exercise;Patient/family education;Modalities    PT Goals (Current goals can be found in the Care Plan section)  Acute Rehab PT Goals Patient Stated Goal: to return home PT Goal Formulation: With patient Time For Goal Achievement: 12/17/22 Potential to Achieve Goals: Good    Frequency BID     Co-evaluation               AM-PAC PT "6 Clicks" Mobility  Outcome Measure Help needed turning from your back to your side while in a flat bed without using bedrails?: A Little Help needed moving from lying on your back to sitting on the side of a flat bed  without using bedrails?: A Little Help needed moving to and from a bed to a chair (including a wheelchair)?: A Little Help needed standing up from a chair using your arms (e.g., wheelchair or bedside chair)?: A Little Help needed to walk in hospital room?: A Little Help needed climbing 3-5 steps with a railing? : A Little 6 Click Score: 18    End of Session   Activity Tolerance: Patient tolerated treatment well Patient left: in chair;with call bell/phone within reach Nurse Communication: Mobility status PT Visit Diagnosis: Other abnormalities of gait and mobility (R26.89);Muscle weakness (generalized) (M62.81);Pain Pain - Right/Left: Right Pain - part of body: Knee    Time: 1610-9604 PT Time Calculation (min) (ACUTE ONLY): 16 min   Charges:   PT Evaluation $PT Eval Moderate Complexity: 1 Mod   PT General Charges $$ ACUTE PT VISIT: 1 Visit         Takia Runyon, PT, GCS 12/15/22,10:02 AM

## 2022-12-15 NOTE — Plan of Care (Signed)
Documented

## 2022-12-15 NOTE — Plan of Care (Signed)
  Problem: Activity: Goal: Ability to avoid complications of mobility impairment will improve Outcome: Progressing   Problem: Pain Management: Goal: Pain level will decrease with appropriate interventions Outcome: Progressing   Problem: Skin Integrity: Goal: Will show signs of wound healing Outcome: Progressing   

## 2022-12-15 NOTE — Progress Notes (Signed)
DISCHARGE NOTE:  Pt given discharge instructions and scripts. 2 honeycomb dressings sent with pt, TED hose on both legs. Pt wheeled to car by staff, husband providing transportation.

## 2022-12-16 ENCOUNTER — Other Ambulatory Visit: Payer: Self-pay

## 2022-12-16 DIAGNOSIS — I252 Old myocardial infarction: Secondary | ICD-10-CM | POA: Diagnosis not present

## 2022-12-16 DIAGNOSIS — Z96651 Presence of right artificial knee joint: Secondary | ICD-10-CM | POA: Diagnosis not present

## 2022-12-16 DIAGNOSIS — Z85828 Personal history of other malignant neoplasm of skin: Secondary | ICD-10-CM | POA: Diagnosis not present

## 2022-12-16 DIAGNOSIS — Z8616 Personal history of COVID-19: Secondary | ICD-10-CM | POA: Diagnosis not present

## 2022-12-16 DIAGNOSIS — Z79899 Other long term (current) drug therapy: Secondary | ICD-10-CM | POA: Diagnosis not present

## 2022-12-16 DIAGNOSIS — L570 Actinic keratosis: Secondary | ICD-10-CM | POA: Diagnosis not present

## 2022-12-16 DIAGNOSIS — Z7982 Long term (current) use of aspirin: Secondary | ICD-10-CM | POA: Diagnosis not present

## 2022-12-16 DIAGNOSIS — Z8673 Personal history of transient ischemic attack (TIA), and cerebral infarction without residual deficits: Secondary | ICD-10-CM | POA: Diagnosis not present

## 2022-12-16 DIAGNOSIS — J45909 Unspecified asthma, uncomplicated: Secondary | ICD-10-CM | POA: Diagnosis not present

## 2022-12-16 DIAGNOSIS — I2510 Atherosclerotic heart disease of native coronary artery without angina pectoris: Secondary | ICD-10-CM | POA: Diagnosis not present

## 2022-12-16 DIAGNOSIS — R7303 Prediabetes: Secondary | ICD-10-CM | POA: Diagnosis not present

## 2022-12-16 DIAGNOSIS — Z791 Long term (current) use of non-steroidal anti-inflammatories (NSAID): Secondary | ICD-10-CM | POA: Diagnosis not present

## 2022-12-16 DIAGNOSIS — Z471 Aftercare following joint replacement surgery: Secondary | ICD-10-CM | POA: Diagnosis not present

## 2022-12-16 DIAGNOSIS — E039 Hypothyroidism, unspecified: Secondary | ICD-10-CM | POA: Diagnosis not present

## 2022-12-16 DIAGNOSIS — G4733 Obstructive sleep apnea (adult) (pediatric): Secondary | ICD-10-CM | POA: Diagnosis not present

## 2022-12-16 MED ORDER — HYDROCODONE-ACETAMINOPHEN 5-325 MG PO TABS
1.0000 | ORAL_TABLET | ORAL | 0 refills | Status: DC | PRN
  Filled 2022-12-16: qty 30, 5d supply, fill #0

## 2022-12-29 DIAGNOSIS — M25561 Pain in right knee: Secondary | ICD-10-CM | POA: Diagnosis not present

## 2022-12-29 DIAGNOSIS — Z96651 Presence of right artificial knee joint: Secondary | ICD-10-CM | POA: Diagnosis not present

## 2023-01-03 DIAGNOSIS — Z96651 Presence of right artificial knee joint: Secondary | ICD-10-CM | POA: Diagnosis not present

## 2023-01-03 DIAGNOSIS — M25561 Pain in right knee: Secondary | ICD-10-CM | POA: Diagnosis not present

## 2023-01-06 DIAGNOSIS — M25561 Pain in right knee: Secondary | ICD-10-CM | POA: Diagnosis not present

## 2023-01-06 DIAGNOSIS — Z96651 Presence of right artificial knee joint: Secondary | ICD-10-CM | POA: Diagnosis not present

## 2023-01-10 DIAGNOSIS — Z96651 Presence of right artificial knee joint: Secondary | ICD-10-CM | POA: Diagnosis not present

## 2023-01-10 DIAGNOSIS — M25561 Pain in right knee: Secondary | ICD-10-CM | POA: Diagnosis not present

## 2023-01-12 DIAGNOSIS — Z96651 Presence of right artificial knee joint: Secondary | ICD-10-CM | POA: Diagnosis not present

## 2023-01-12 DIAGNOSIS — M25561 Pain in right knee: Secondary | ICD-10-CM | POA: Diagnosis not present

## 2023-01-15 DIAGNOSIS — Z471 Aftercare following joint replacement surgery: Secondary | ICD-10-CM | POA: Diagnosis not present

## 2023-01-16 ENCOUNTER — Ambulatory Visit: Payer: PPO | Admitting: Dermatology

## 2023-01-16 DIAGNOSIS — Z96651 Presence of right artificial knee joint: Secondary | ICD-10-CM | POA: Diagnosis not present

## 2023-01-16 DIAGNOSIS — M25561 Pain in right knee: Secondary | ICD-10-CM | POA: Diagnosis not present

## 2023-01-18 DIAGNOSIS — M25561 Pain in right knee: Secondary | ICD-10-CM | POA: Diagnosis not present

## 2023-01-18 DIAGNOSIS — Z96651 Presence of right artificial knee joint: Secondary | ICD-10-CM | POA: Diagnosis not present

## 2023-01-19 ENCOUNTER — Ambulatory Visit: Payer: PPO | Admitting: Dermatology

## 2023-01-19 ENCOUNTER — Encounter: Payer: Self-pay | Admitting: Dermatology

## 2023-01-19 VITALS — BP 116/69

## 2023-01-19 DIAGNOSIS — W908XXA Exposure to other nonionizing radiation, initial encounter: Secondary | ICD-10-CM | POA: Diagnosis not present

## 2023-01-19 DIAGNOSIS — L578 Other skin changes due to chronic exposure to nonionizing radiation: Secondary | ICD-10-CM

## 2023-01-19 DIAGNOSIS — D229 Melanocytic nevi, unspecified: Secondary | ICD-10-CM

## 2023-01-19 DIAGNOSIS — L820 Inflamed seborrheic keratosis: Secondary | ICD-10-CM

## 2023-01-19 DIAGNOSIS — Z1283 Encounter for screening for malignant neoplasm of skin: Secondary | ICD-10-CM | POA: Diagnosis not present

## 2023-01-19 DIAGNOSIS — Z872 Personal history of diseases of the skin and subcutaneous tissue: Secondary | ICD-10-CM

## 2023-01-19 DIAGNOSIS — L814 Other melanin hyperpigmentation: Secondary | ICD-10-CM

## 2023-01-19 DIAGNOSIS — Z85828 Personal history of other malignant neoplasm of skin: Secondary | ICD-10-CM | POA: Diagnosis not present

## 2023-01-19 DIAGNOSIS — L719 Rosacea, unspecified: Secondary | ICD-10-CM | POA: Diagnosis not present

## 2023-01-19 DIAGNOSIS — D2262 Melanocytic nevi of left upper limb, including shoulder: Secondary | ICD-10-CM | POA: Diagnosis not present

## 2023-01-19 DIAGNOSIS — L821 Other seborrheic keratosis: Secondary | ICD-10-CM

## 2023-01-19 NOTE — Progress Notes (Signed)
Follow-Up Visit   Subjective  Bridget Howe is a 77 y.o. female who presents for the following: Skin Cancer Screening and Full Body Skin Exam,hx of SCC R dorsal hand, L med canthus, hx of Aks, she has a couple pink scaly spots on her forehead that persist that she picks at  The patient presents for Total-Body Skin Exam (TBSE) for skin cancer screening and mole check. The patient has spots, moles and lesions to be evaluated, some may be new or changing and the patient may have concern these could be cancer.    The following portions of the chart were reviewed this encounter and updated as appropriate: medications, allergies, medical history  Review of Systems:  No other skin or systemic complaints except as noted in HPI or Assessment and Plan.  Objective  Well appearing patient in no apparent distress; mood and affect are within normal limits.  A full examination was performed including scalp, head, eyes, ears, nose, lips, neck, chest, axillae, abdomen, back, buttocks, bilateral upper extremities, bilateral lower extremities, hands, feet, fingers, toes, fingernails, and toenails. All findings within normal limits unless otherwise noted below.   Relevant physical exam findings are noted in the Assessment and Plan.  mid forehead x 1, L eyebrow x 1 (2) Tiny pink/red scaly macule mid forehead, pink/brown scaly macule L eyebrow    Assessment & Plan   SKIN CANCER SCREENING PERFORMED TODAY.  ACTINIC DAMAGE - Chronic condition, secondary to cumulative UV/sun exposure - diffuse scaly erythematous macules with underlying dyspigmentation - Recommend daily broad spectrum sunscreen SPF 30+ to sun-exposed areas, reapply every 2 hours as needed.  - Staying in the shade or wearing long sleeves, sun glasses (UVA+UVB protection) and wide brim hats (4-inch brim around the entire circumference of the hat) are also recommended for sun protection.  - Call for new or changing  lesions.  LENTIGINES, SEBORRHEIC KERATOSES, HEMANGIOMAS - Benign normal skin lesions - Benign-appearing - Call for any changes  MELANOCYTIC NEVI - Tan-brown and/or pink-flesh-colored symmetric macules and papules - Benign appearing on exam today - Observation - Call clinic for new or changing moles - Recommend daily use of broad spectrum spf 30+ sunscreen to sun-exposed areas.  - L forearm regular brown macule  HISTORY OF SQUAMOUS CELL CARCINOMA OF THE SKIN - No evidence of recurrence today- L medial canthus, R dorsal hand - Recommend regular full body skin exams - Recommend daily broad spectrum sunscreen SPF 30+ to sun-exposed areas, reapply every 2 hours as needed.  - Call if any new or changing lesions are noted between office visits    SEBORRHEIC KERATOSIS vrs LENTIGO L med lower eyelid near prior [facility] Exam: 7.0 x 5mm tan slightly waxy macule with fading, photo compared  Treatment: Benign-appearing. Stable compared to previous visit/photo. Observation.  Call clinic for new or changing moles.  Recommend daily use of broad spectrum spf 30+ sunscreen to sun-exposed areas.  ROSACEA face Exam Mid face erythema with telangiectasias   Chronic and persistent condition with duration or expected duration over one year. Condition is present but not bothersome to patient.   Rosacea is a chronic progressive skin condition usually affecting the face of adults, causing redness and/or acne bumps. It is treatable but not curable. It sometimes affects the eyes (ocular rosacea) as well. It may respond to topical and/or systemic medication and can flare with stress, sun exposure, alcohol, exercise, topical steroids (including hydrocortisone/cortisone 10) and some foods.  Daily application of broad spectrum spf 30+ sunscreen  to face is recommended to reduce flares.  Patient denies grittiness of the eyes  Treatment Plan Discussed topical treatments, pt declines     HISTORY OF PRECANCEROUS  ACTINIC KERATOSIS - site(s) of PreCancerous Actinic Keratosis clear today. - these may recur and new lesions may form requiring treatment to prevent transformation into skin cancer - observe for new or changing spots and contact [facility] for appointment if occur - photoprotection with sun protective clothing; sunglasses and broad spectrum sunscreen with SPF of at least 30 + and frequent self skin exams recommended - yearly exams by a dermatologist recommended for persons with history of PreCancerous Actinic Keratoses   Inflamed seborrheic keratosis (2) mid forehead x 1, L eyebrow x 1  Vs AKs  Symptomatic, irritating, patient would like treated.  ISK R pretibia not treated today due to current infection at R knee after recent knee surgery- recheck on f/up  Destruction of lesion - mid forehead x 1, L eyebrow x 1 (2)  Destruction method: cryotherapy   Informed consent: discussed and consent obtained   Lesion destroyed using liquid nitrogen: Yes   Region frozen until ice ball extended beyond lesion: Yes   Outcome: patient tolerated procedure well with no complications   Post-procedure details: wound care instructions given   Additional details:  Prior to procedure, discussed risks of blister formation, small wound, skin dyspigmentation, or rare scar following cryotherapy. Recommend Vaseline ointment to treated areas while healing.   Skin cancer screening  Actinic skin damage  Nevus  Rosacea  History of actinic keratosis  History of SCC (squamous cell carcinoma) of skin  Lentigo  Seborrheic keratosis   Return in about 1 year (around 01/19/2024) for TBSE, Hx of SCC, Hx of AKs.  I, Ardis Rowan, RMA, am acting as scribe for Willeen Niece, MD .   Documentation: I have reviewed the above documentation for accuracy and completeness, and I agree with the above.  Willeen Niece, MD

## 2023-01-19 NOTE — Patient Instructions (Addendum)

## 2023-01-24 DIAGNOSIS — M25561 Pain in right knee: Secondary | ICD-10-CM | POA: Diagnosis not present

## 2023-01-24 DIAGNOSIS — Z96651 Presence of right artificial knee joint: Secondary | ICD-10-CM | POA: Diagnosis not present

## 2023-01-26 DIAGNOSIS — Z96651 Presence of right artificial knee joint: Secondary | ICD-10-CM | POA: Diagnosis not present

## 2023-01-26 DIAGNOSIS — M25561 Pain in right knee: Secondary | ICD-10-CM | POA: Diagnosis not present

## 2023-01-28 ENCOUNTER — Inpatient Hospital Stay
Admission: EM | Admit: 2023-01-28 | Discharge: 2023-02-04 | DRG: 684 | Disposition: A | Discharge status: Home / Self Care | Payer: PPO | Attending: Internal Medicine | Admitting: Internal Medicine

## 2023-01-28 ENCOUNTER — Emergency Department: Payer: PPO

## 2023-01-28 ENCOUNTER — Other Ambulatory Visit: Payer: Self-pay

## 2023-01-28 DIAGNOSIS — R634 Abnormal weight loss: Secondary | ICD-10-CM | POA: Diagnosis present

## 2023-01-28 DIAGNOSIS — Z96651 Presence of right artificial knee joint: Secondary | ICD-10-CM | POA: Diagnosis present

## 2023-01-28 DIAGNOSIS — Z888 Allergy status to other drugs, medicaments and biological substances status: Secondary | ICD-10-CM

## 2023-01-28 DIAGNOSIS — R7401 Elevation of levels of liver transaminase levels: Secondary | ICD-10-CM | POA: Diagnosis present

## 2023-01-28 DIAGNOSIS — I6782 Cerebral ischemia: Secondary | ICD-10-CM | POA: Diagnosis not present

## 2023-01-28 DIAGNOSIS — R531 Weakness: Secondary | ICD-10-CM | POA: Diagnosis not present

## 2023-01-28 DIAGNOSIS — Z85828 Personal history of other malignant neoplasm of skin: Secondary | ICD-10-CM

## 2023-01-28 DIAGNOSIS — E039 Hypothyroidism, unspecified: Secondary | ICD-10-CM | POA: Diagnosis present

## 2023-01-28 DIAGNOSIS — Z9109 Other allergy status, other than to drugs and biological substances: Secondary | ICD-10-CM

## 2023-01-28 DIAGNOSIS — G4733 Obstructive sleep apnea (adult) (pediatric): Secondary | ICD-10-CM | POA: Diagnosis present

## 2023-01-28 DIAGNOSIS — Z808 Family history of malignant neoplasm of other organs or systems: Secondary | ICD-10-CM

## 2023-01-28 DIAGNOSIS — Z8616 Personal history of COVID-19: Secondary | ICD-10-CM

## 2023-01-28 DIAGNOSIS — Z1152 Encounter for screening for COVID-19: Secondary | ICD-10-CM

## 2023-01-28 DIAGNOSIS — R748 Abnormal levels of other serum enzymes: Secondary | ICD-10-CM

## 2023-01-28 DIAGNOSIS — I2510 Atherosclerotic heart disease of native coronary artery without angina pectoris: Secondary | ICD-10-CM | POA: Diagnosis present

## 2023-01-28 DIAGNOSIS — R112 Nausea with vomiting, unspecified: Secondary | ICD-10-CM | POA: Diagnosis not present

## 2023-01-28 DIAGNOSIS — Z8673 Personal history of transient ischemic attack (TIA), and cerebral infarction without residual deficits: Secondary | ICD-10-CM | POA: Diagnosis not present

## 2023-01-28 DIAGNOSIS — Z7982 Long term (current) use of aspirin: Secondary | ICD-10-CM

## 2023-01-28 DIAGNOSIS — R42 Dizziness and giddiness: Secondary | ICD-10-CM | POA: Diagnosis present

## 2023-01-28 DIAGNOSIS — I10 Essential (primary) hypertension: Secondary | ICD-10-CM | POA: Insufficient documentation

## 2023-01-28 DIAGNOSIS — Z87891 Personal history of nicotine dependence: Secondary | ICD-10-CM

## 2023-01-28 DIAGNOSIS — Z79899 Other long term (current) drug therapy: Secondary | ICD-10-CM

## 2023-01-28 DIAGNOSIS — I252 Old myocardial infarction: Secondary | ICD-10-CM

## 2023-01-28 DIAGNOSIS — E860 Dehydration: Secondary | ICD-10-CM | POA: Diagnosis present

## 2023-01-28 DIAGNOSIS — R7303 Prediabetes: Secondary | ICD-10-CM | POA: Diagnosis present

## 2023-01-28 DIAGNOSIS — K649 Unspecified hemorrhoids: Secondary | ICD-10-CM | POA: Diagnosis present

## 2023-01-28 DIAGNOSIS — Z91012 Allergy to eggs: Secondary | ICD-10-CM

## 2023-01-28 DIAGNOSIS — Z8249 Family history of ischemic heart disease and other diseases of the circulatory system: Secondary | ICD-10-CM

## 2023-01-28 DIAGNOSIS — J45909 Unspecified asthma, uncomplicated: Secondary | ICD-10-CM | POA: Diagnosis present

## 2023-01-28 DIAGNOSIS — Z8701 Personal history of pneumonia (recurrent): Secondary | ICD-10-CM

## 2023-01-28 DIAGNOSIS — N179 Acute kidney failure, unspecified: Secondary | ICD-10-CM

## 2023-01-28 DIAGNOSIS — Z79890 Hormone replacement therapy (postmenopausal): Secondary | ICD-10-CM

## 2023-01-28 DIAGNOSIS — T368X5A Adverse effect of other systemic antibiotics, initial encounter: Secondary | ICD-10-CM

## 2023-01-28 DIAGNOSIS — T370X5A Adverse effect of sulfonamides, initial encounter: Secondary | ICD-10-CM | POA: Diagnosis present

## 2023-01-28 DIAGNOSIS — Z803 Family history of malignant neoplasm of breast: Secondary | ICD-10-CM

## 2023-01-28 DIAGNOSIS — Z833 Family history of diabetes mellitus: Secondary | ICD-10-CM

## 2023-01-28 DIAGNOSIS — M1711 Unilateral primary osteoarthritis, right knee: Secondary | ICD-10-CM | POA: Diagnosis present

## 2023-01-28 DIAGNOSIS — T8141XD Infection following a procedure, superficial incisional surgical site, subsequent encounter: Secondary | ICD-10-CM

## 2023-01-28 DIAGNOSIS — Z887 Allergy status to serum and vaccine status: Secondary | ICD-10-CM

## 2023-01-28 DIAGNOSIS — D869 Sarcoidosis, unspecified: Secondary | ICD-10-CM | POA: Diagnosis present

## 2023-01-28 LAB — BASIC METABOLIC PANEL
Anion gap: 9 (ref 5–15)
Anion gap: 7 (ref 5–15)
BUN: 33 mg/dL — ABNORMAL HIGH (ref 8–23)
BUN: 32 mg/dL — ABNORMAL HIGH (ref 8–23)
CO2: 27 mmol/L (ref 22–32)
CO2: 27 mmol/L (ref 22–32)
Calcium: 14.4 mg/dL (ref 8.9–10.3)
Calcium: 13 mg/dL — ABNORMAL HIGH (ref 8.9–10.3)
Chloride: 98 mmol/L (ref 98–111)
Chloride: 101 mmol/L (ref 98–111)
Creatinine, Ser: 1.58 mg/dL — ABNORMAL HIGH (ref 0.44–1.00)
Creatinine, Ser: 1.57 mg/dL — ABNORMAL HIGH (ref 0.44–1.00)
GFR, Estimated: 34 mL/min — ABNORMAL LOW (ref >60)
GFR, Estimated: 34 mL/min — ABNORMAL LOW (ref >60)
Glucose, Bld: 103 mg/dL — ABNORMAL HIGH (ref 70–99)
Glucose, Bld: 95 mg/dL (ref 70–99)
Potassium: 4.6 mmol/L (ref 3.5–5.1)
Potassium: 4.6 mmol/L (ref 3.5–5.1)
Sodium: 134 mmol/L — ABNORMAL LOW (ref 135–145)
Sodium: 135 mmol/L (ref 135–145)

## 2023-01-28 LAB — CBC
HCT: 40.3 % (ref 36.0–46.0)
Hemoglobin: 13.6 g/dL (ref 12.0–15.0)
MCH: 32 pg (ref 26.0–34.0)
MCHC: 33.7 g/dL (ref 30.0–36.0)
MCV: 94.8 fL (ref 80.0–100.0)
Platelets: 372 10*3/uL (ref 150–400)
RBC: 4.25 MIL/uL (ref 3.87–5.11)
RDW: 12.2 % (ref 11.5–15.5)
WBC: 7.8 10*3/uL (ref 4.0–10.5)
nRBC: 0 % (ref 0.0–0.2)

## 2023-01-28 LAB — URINALYSIS, ROUTINE W REFLEX MICROSCOPIC
Bilirubin Urine: NEGATIVE
Glucose, UA: NEGATIVE mg/dL
Hgb urine dipstick: NEGATIVE
Ketones, ur: NEGATIVE mg/dL
Leukocytes,Ua: NEGATIVE
Nitrite: NEGATIVE
Protein, ur: NEGATIVE mg/dL
Specific Gravity, Urine: 1.011 (ref 1.005–1.030)
pH: 5 (ref 5.0–8.0)

## 2023-01-28 LAB — TSH: TSH: 3.936 u[IU]/mL (ref 0.350–4.500)

## 2023-01-28 LAB — TROPONIN I (HIGH SENSITIVITY)
Troponin I (High Sensitivity): 10 ng/L (ref <18)
Troponin I (High Sensitivity): 11 ng/L (ref <18)

## 2023-01-28 LAB — HEPATIC FUNCTION PANEL
ALT: 163 U/L — ABNORMAL HIGH (ref 0–44)
AST: 104 U/L — ABNORMAL HIGH (ref 15–41)
Albumin: 3.7 g/dL (ref 3.5–5.0)
Alkaline Phosphatase: 137 U/L — ABNORMAL HIGH (ref 38–126)
Bilirubin, Direct: <0.1 mg/dL (ref 0.0–0.2)
Total Bilirubin: 0.6 mg/dL (ref 0.3–1.2)
Total Protein: 7.8 g/dL (ref 6.5–8.1)

## 2023-01-28 LAB — SARS CORONAVIRUS 2 BY RT PCR: SARS Coronavirus 2 by RT PCR: NEGATIVE

## 2023-01-28 MED ORDER — LEVOTHYROXINE SODIUM 50 MCG PO TABS
50.0000 ug | ORAL_TABLET | Freq: Every day | ORAL | Status: DC
  Administered 2023-01-29 – 2023-02-04 (×7): 50 ug via ORAL
  Filled 2023-01-28 (×8): qty 1

## 2023-01-28 MED ORDER — ONDANSETRON HCL 4 MG PO TABS: 4.0000 mg | ORAL_TABLET | Freq: Four times a day (QID) | ORAL | Status: DC | PRN

## 2023-01-28 MED ORDER — TRAMADOL HCL 50 MG PO TABS
50.0000 mg | ORAL_TABLET | ORAL | Status: DC | PRN
  Administered 2023-02-02: 100 mg via ORAL
  Filled 2023-01-28: qty 2

## 2023-01-28 MED ORDER — ACETAMINOPHEN 325 MG PO TABS
650.0000 mg | ORAL_TABLET | Freq: Four times a day (QID) | ORAL | Status: DC | PRN
  Filled 2023-01-28: qty 2

## 2023-01-28 MED ORDER — INSULIN ASPART 100 UNIT/ML IJ SOLN
0.0000 [IU] | Freq: Three times a day (TID) | INTRAMUSCULAR | Status: DC
  Filled 2023-01-28 (×2): qty 1

## 2023-01-28 MED ORDER — SODIUM CHLORIDE 0.9 % IV SOLN: Freq: Once | INTRAVENOUS | Status: AC

## 2023-01-28 MED ORDER — ENOXAPARIN SODIUM 30 MG/0.3ML IJ SOSY
30.0000 mg | PREFILLED_SYRINGE | INTRAMUSCULAR | Status: DC
  Administered 2023-01-29 (×2): 30 mg via SUBCUTANEOUS
  Filled 2023-01-28 (×2): qty 0.3

## 2023-01-28 MED ORDER — ACETAMINOPHEN 650 MG RE SUPP: 650.0000 mg | Freq: Four times a day (QID) | RECTAL | Status: DC | PRN

## 2023-01-28 MED ORDER — ONDANSETRON HCL 4 MG/2ML IJ SOLN
4.0000 mg | Freq: Four times a day (QID) | INTRAMUSCULAR | Status: DC | PRN
  Administered 2023-02-02: 4 mg via INTRAVENOUS
  Filled 2023-01-28: qty 2

## 2023-01-28 MED ORDER — SODIUM CHLORIDE 0.9 % IV BOLUS
1000.0000 mL | Freq: Once | INTRAVENOUS | Status: AC
  Administered 2023-01-28: 1000 mL via INTRAVENOUS

## 2023-01-28 MED ORDER — HYDRALAZINE HCL 25 MG PO TABS
25.0000 mg | ORAL_TABLET | Freq: Three times a day (TID) | ORAL | Status: DC
  Administered 2023-01-28 – 2023-01-30 (×6): 25 mg via ORAL
  Filled 2023-01-28 (×6): qty 1

## 2023-01-28 MED ORDER — MECLIZINE HCL 25 MG PO TABS
25.0000 mg | ORAL_TABLET | Freq: Once | ORAL | Status: AC
  Administered 2023-01-28: 25 mg via ORAL
  Filled 2023-01-28: qty 1

## 2023-01-28 MED ORDER — SODIUM CHLORIDE 0.9 % IV SOLN: INTRAVENOUS | Status: DC

## 2023-01-28 NOTE — Assessment & Plan Note (Addendum)
Surgical incision infection 9/14, completed antibiotics 9/25 Still has some residual redness over the scar Recently followed up with Ortho and they said no need for continued antibiotics

## 2023-01-28 NOTE — Assessment & Plan Note (Addendum)
Continue aspirin History of an MI at age 77

## 2023-01-28 NOTE — H&P (Addendum)
History and Physical    Patient: Bridget Howe ZOX:[id number] DOB: 1945-05-16 DOA: 01/28/2023 DOS: the patient was seen and examined on 01/28/2023 PCP: Lauro Regulus, MD  Patient coming from: Home  Chief Complaint:  Chief Complaint  Patient presents with   Dizziness    HPI: Bridget Howe is a 77 y.o. 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 She also feels like her vision is blurred.  She has associated nausea.  She denies headache,  one-sided weakness numbness or tingling, slurred speech or difficulty swallowing.  Denies chest pain or shortness of breath or palpitations.  Denies fever or chills. States that 2 weeks ago when she developed the infection in her knee she also had a 3-day bout of diarrhea.  Since that time she has had decreased oral intake and states she has lost up to 10 pounds.  Her wound infection cleared up with the Bactrim and she has since followed up with orthopedics. ED course and data review: BP 184/85 with heart rate in the 50s and 60s and otherwise normal vitals Labs: Notable for normal CBC, CMP notable for creatinine of 1.57 which is up from baseline of 0.83 a month prior.  Calcium noted to be elevated at 14.4 up from 9.3 a month prior.  Also has elevated liver enzymes with AST 104, ALT 163 and alk phos 137. TSH normal COVID-negative UA unremarkable EKG, personally viewed and interpreted showing NSR at 63 with no acute ST-T wave changes MRI brain IMPRESSION: No acute or reversible finding. Mild chronic small-vessel ischemic changes of the pons and cerebellum. Minimal chronic small-vessel change of the cerebral hemispheric white matter.  Patient treated with a 1 L NS  bolus, meclizine  Hospitalist consulted for admission.    Review of Systems: As mentioned in the history of present illness. All other systems reviewed and are negative.  Past Medical History:  Diagnosis Date   Actinic keratosis    Arthritis    Asthma    Complication of anesthesia    a.) egg white/yolk allergy - unable to tolerate propofol   Complication of anesthesia    limited ROM cervical   Coronary artery disease    COVID-19    Dysrhythmia    Hemorrhoids    Hypoglycemia    Hypothyroidism    Myocardial infarction (HCC)    anterior wall MI at 77 yr old   OSA treated with BiPAP    Prediabetes 10/08/2014   Primary osteoarthritis of right knee    Squamous cell carcinoma of skin 09/05/2007   Right dorsal hand   Squamous cell carcinoma of skin    L medial canthus, treated around 1992   Stroke [facility])    scan shows possible mini strokes   Past Surgical History:  Procedure Laterality Date   BREAST CYST ASPIRATION Bilateral    3 on right, 2 on the left   CARDIAC CATHETERIZATION     CARPAL TUNNEL RELEASE Bilateral    CATARACT EXTRACTION W/PHACO Left 05/10/2022   Procedure: CATARACT EXTRACTION PHACO AND INTRAOCULAR LENS PLACEMENT (IOC) LEFT  CLAREON VIVITY LENS  7.00  00:38.9;  Surgeon: Galen Manila, MD;  Location: [facility];  Service: Ophthalmology;  Laterality: Left;   CATARACT EXTRACTION W/PHACO  Right 05/24/2022   Procedure: CATARACT EXTRACTION PHACO AND INTRAOCULAR LENS PLACEMENT (IOC) RIGHT CLAREON VIVITY 6.22 00:40.2;  Surgeon: Galen Manila, MD;  Location: [facility];  Service: Ophthalmology;  Laterality: Right;   COLONOSCOPY WITH PROPOFOL N/A 03/09/2016   Procedure: COLONOSCOPY WITH PROPOFOL;  Surgeon: Scot Jun, MD;  Location: [facility] ENDOSCOPY;  Service: Endoscopy;  Laterality: N/A;   ESOPHAGOGASTRODUODENOSCOPY (EGD) WITH PROPOFOL N/A 03/09/2016   Procedure: ESOPHAGOGASTRODUODENOSCOPY (EGD) WITH PROPOFOL;  Surgeon: Scot Jun, MD;   Location: [facility] ENDOSCOPY;  Service: Endoscopy;  Laterality: N/A;   KNEE ARTHROPLASTY Right 12/14/2022   Procedure: COMPUTER ASSISTED TOTAL KNEE ARTHROPLASTY;  Surgeon: Donato Heinz, MD;  Location: [facility] ORS;  Service: Orthopedics;  Laterality: Right;   KNEE ARTHROSCOPY Right    MENISCUS REPAIR Right    NECK SURGERY     TENDON REPAIR Left    wrist   TONSILLECTOMY     TRIGGER FINGER RELEASE Right    Social History:  reports that she has quit smoking. She has never used smokeless tobacco. She reports that she does not drink alcohol and does not use drugs.  Allergies  Allergen Reactions   Influenza Vaccines Anaphylaxis   Caffeine    Copper-Containing Compounds    Covid-19 (Subunit) Vaccine Other (See Comments)    Chest pain   Egg-Derived Products Swelling   Nexium [Esomeprazole] Cough    Joint pain   Nickel    Propofol Other (See Comments)    Patient prefers to avoid due to egg allergy   Pseudoephedrine Hcl    Ranitidine Hcl Cough    Joint pain   Tacrolimus     Family History  Problem Relation Age of Onset   Breast cancer Sister 63   Skin cancer Sister    Diabetes Mellitus II Sister    Hypertension Sister    Melanoma Sister    Hypertension Mother    Heart failure Mother    Skin cancer Father    Melanoma Father    Autoimmune disease Daughter     Prior to Admission medications   Medication Sig Start Date End Date Taking? Authorizing Provider  acetaminophen (TYLENOL) 500 MG tablet Take 500 mg by mouth every 4 (four) hours as needed.    [provider]  aspirin EC 81 MG tablet Take 1 tablet (81 mg total) by mouth in the morning and at bedtime. 12/15/22   Rayburn Go, PA-C  b complex vitamins capsule Take 1 capsule by mouth daily.    [provider]  celecoxib (CELEBREX) 200 MG capsule Take 1 capsule (200 mg total) by mouth 2 (two) times daily. 12/15/22   Rayburn Go, PA-C  cholecalciferol (VITAMIN D3) 25 MCG (1000 UNIT) tablet Take 1,000 Units by  mouth daily.    [provider]  diclofenac sodium (VOLTAREN) 1 % GEL Apply 4 g topically 4 (four) times daily.    [provider]  diphenhydrAMINE (BENADRYL) 25 MG tablet Take 25 mg by mouth at bedtime as needed. Take 1/2 tablet 12.5 mg    [provider]  fluticasone (FLONASE) 50 MCG/ACT nasal spray Place into both nostrils daily.    [provider]  HYDROcodone-acetaminophen (NORCO) 5-325 MG tablet Take 1 tablet by mouth every 4 (four) hours as needed for moderate pain. 12/15/22   Rayburn Go, PA-C  HYDROcodone-acetaminophen (NORCO/VICODIN) 5-325 MG tablet Take 1 tablet by mouth every 4 (four) hours as needed for moderate pain. 12/15/22     levothyroxine (SYNTHROID, LEVOTHROID) 50 MCG  tablet Take 50 mcg by mouth daily before breakfast.    [provider]  Menthol, Topical Analgesic, (BENGAY EX) Apply topically as needed.    [provider]  Misc Natural Products (PYCNOGENOL PLUS PO) Take by mouth.    [provider]  Omega-3 Fatty Acids (OMEGA-3 PO) Take 1 tablet by mouth daily.    [provider]  OVER THE COUNTER MEDICATION 2 tablets daily. Fiber Well gummie    [provider]  Rhubarb (ESTROVEN COMPLETE) 4 MG TABS Take by mouth daily.    [provider]  traMADol (ULTRAM) 50 MG tablet Take 1-2 tablets (50-100 mg total) by mouth every 4 (four) hours as needed for moderate pain. 12/15/22   Rayburn Go, PA-C  Wheat Dextrin (BENEFIBER DRINK MIX PO) Take 2 Scoops by mouth at bedtime.    [provider]    Physical Exam: Vitals:   01/28/23 1639 01/28/23 1700 01/28/23 1800 01/28/23 1911  BP: (!) 152/95 (!) 179/75 (!) 179/78 (!) 172/79  Pulse: 68 66 (!) 55 (!) 59  Resp:   16 14  Temp:      TempSrc:      SpO2: 98% 96% 96% 97%  Weight:      Height:       Physical Exam Vitals and nursing note reviewed.  Constitutional:      General: She is not in acute distress. HENT:     Head:  Normocephalic and atraumatic.  Cardiovascular:     Rate and Rhythm: Normal rate and regular rhythm.     Heart sounds: Normal heart sounds.  Pulmonary:     Effort: Pulmonary effort is normal.     Breath sounds: Normal breath sounds.  Abdominal:     Palpations: Abdomen is soft.     Tenderness: There is no abdominal tenderness.  Musculoskeletal:     Comments: Residual redness over incision on right knee  Neurological:     Mental Status: Mental status is at baseline.     Labs on Admission: I have personally reviewed following labs and imaging studies  CBC: Recent Labs  Lab 01/28/23 1609  WBC 7.8  HGB 13.6  HCT 40.3  MCV 94.8  PLT 372   Basic Metabolic Panel: Recent Labs  Lab 01/28/23 1609 01/28/23 1814  NA 134* 135  K 4.6 4.6  CL 98 101  CO2 27 27  GLUCOSE 103* 95  BUN 33* 32*  CREATININE 1.58* 1.57*  CALCIUM 14.4* 13.0*   GFR: Estimated Creatinine Clearance: 27.6 mL/min (A) (by C-G formula based on SCr of 1.57 mg/dL (H)). Liver Function Tests: Recent Labs  Lab 01/28/23 1636  AST 104*  ALT 163*  ALKPHOS 137*  BILITOT 0.6  PROT 7.8  ALBUMIN 3.7   No results for input(s): "LIPASE", "AMYLASE" in the last 168 hours. No results for input(s): "AMMONIA" in the last 168 hours. Coagulation Profile: No results for input(s): "INR", "PROTIME" in the last 168 hours. Cardiac Enzymes: No results for input(s): "CKTOTAL", "CKMB", "CKMBINDEX", "TROPONINI" in the last 168 hours. BNP (last 3 results) No results for input(s): "PROBNP" in the last 8760 hours. HbA1C: No results for input(s): "HGBA1C" in the last 72 hours. CBG: No results for input(s): "GLUCAP" in the last 168 hours. Lipid Profile: No results for input(s): "CHOL", "HDL", "LDLCALC", "TRIG", "CHOLHDL", "LDLDIRECT" in the last 72 hours. Thyroid Function Tests: Recent Labs    01/28/23 1636  TSH 3.936   Anemia Panel: No results for input(s): "VITAMINB12", "FOLATE", "FERRITIN", "TIBC", "  IRON", "RETICCTPCT"  in the last 72 hours. Urine analysis:    Component Value Date/Time   COLORURINE AMBER (A) 01/28/2023 1608   APPEARANCEUR CLOUDY (A) 01/28/2023 1608   LABSPEC 1.011 01/28/2023 1608   PHURINE 5.0 01/28/2023 1608   GLUCOSEU NEGATIVE 01/28/2023 1608   HGBUR NEGATIVE 01/28/2023 1608   BILIRUBINUR NEGATIVE 01/28/2023 1608   KETONESUR NEGATIVE 01/28/2023 1608   PROTEINUR NEGATIVE 01/28/2023 1608   NITRITE NEGATIVE 01/28/2023 1608   LEUKOCYTESUR NEGATIVE 01/28/2023 1608    Radiological Exams on Admission: MR BRAIN WO CONTRAST  Result Date: 01/28/2023 CLINICAL DATA:  Dizziness over the last 2 days which is constant. EXAM: MRI HEAD WITHOUT CONTRAST TECHNIQUE: Multiplanar, multiecho pulse sequences of the brain and surrounding structures were obtained without intravenous contrast. COMPARISON:  10/10/2014 FINDINGS: Brain: Diffusion imaging does not show any acute or subacute infarction. There are mild chronic small-vessel ischemic changes of the pons. 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 Cerebral hemispheres show minimal chronic small-vessel change of the white matter. No cortical or large vessel territory infarction. No mass lesion, hemorrhage, hydrocephalus or extra-axial collection. Vascular: Major vessels at the base of the brain show flow. Skull and upper cervical spine: Negative Sinuses/Orbits: Clear/normal Other: No fluid in the middle ears or mastoid air cells. IMPRESSION: No acute or reversible finding. Mild chronic small-vessel ischemic changes of the pons and cerebellum. Minimal chronic small-vessel change of the cerebral hemispheric white matter. Electronically Signed   By: Paulina Fusi M.D.   On: 01/28/2023 21:01     Data Reviewed: Relevant notes from primary care and specialist visits, past discharge summaries as available in EHR, including Care Everywhere. Prior diagnostic testing as pertinent to current admission diagnoses Updated medications and  problem lists for reconciliation ED course, including vitals, labs, imaging, treatment and response to treatment Triage notes, nursing and pharmacy notes and ED provider's notes Notable results as noted in HPI   Assessment and Plan: * Vertigo Possible symptomatic hypercalcemia, dehydration MRI negative for CVA IV hydration PT consult  Hypercalcemia IV hydration and monitor Follow-up PTH Consider zoledronic acid if not improving with hydration   AKI (acute kidney injury) (HCC) Suspect dehydration as well as medication related ATN/possible AIN Hold Celebrex and recently completed a course of Bactrim Avoid all nephrotoxins IV hydration and monitor renal function Can consider nephrology consult if not improving with hydration  Unintentional weight loss Patient lost 10 pounds in the past couple weeks likely due to poor oral intake and GI illness a couple weeks prior however with hypercalcemia, might consider malignancy workup if over will symptoms not improving with hydration  Elevated liver enzymes Possibly related to adverse effect of Bactrim, Tylenol for pain IV hydration and monitor Consider additional imaging if not improving with hydration  Adverse effect of sulfamethoxazole/trimethoprim Suspect causing both abnormal renal function and abnormal liver function Patient no longer on Bactrim which she completed 3 days prior  Prediabetes Will get A1c and twice daily fingersticks Consistent carbohydrate diet  S/P TKR (total knee replacement), 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 Surgical incision infection 9/14, completed antibiotics 9/25 Still has some residual redness over the scar Recently followed up with Ortho and they said no need for continued antibiotics  HTN (hypertension) Blood pressure somewhat uncontrolled Started on oral hydralazine 3 times daily  Coronary artery disease Continue aspirin History of an MI at age 24  OSA on CPAP CPAP nightly  Hypothyroid Continue  levothyroxine    DVT prophylaxis: Lovenox  Consults: none  Advance Care Planning:   Code Status: Prior   Family Communication: Husband at bedside  Disposition Plan: Back to previous home environment  Severity of Illness: The appropriate patient status for this patient is OBSERVATION. Observation status is judged to be reasonable and necessary in order to provide the required intensity of service to ensure the patient's safety. The patient's presenting symptoms, physical exam findings, and initial radiographic and laboratory data in the context of their medical condition is felt to place them at decreased risk for further clinical deterioration. Furthermore, it is anticipated that the patient will be medically stable for discharge from the hospital within 2 midnights of admission.   Author: Andris Baumann, MD 01/28/2023 7:30 PM  For on call review [email or web address].

## 2023-01-28 NOTE — Assessment & Plan Note (Signed)
CPAP nightly

## 2023-01-28 NOTE — Assessment & Plan Note (Addendum)
IV hydration and monitor Follow-up PTH Consider zoledronic acid if not improving with hydration

## 2023-01-28 NOTE — Assessment & Plan Note (Addendum)
Possible symptomatic hypercalcemia, dehydration MRI negative for CVA IV hydration PT consult

## 2023-01-28 NOTE — Assessment & Plan Note (Addendum)
Possibly related to adverse effect of Bactrim, Tylenol for pain IV hydration and monitor Consider additional imaging if not improving with hydration

## 2023-01-28 NOTE — ED Provider Notes (Signed)
[facility] Provider Note    Event Date/Time   First MD Initiated Contact with Patient 01/28/23 1614     (approximate)   History   Dizziness   HPI  Bridget Howe is a 77 y.o. female who presented to the emergency department today because of concerns for weakness, lightheadedness and dizziness.  Symptoms started 3 days ago.  The patient states that roughly 6 weeks ago she underwent a right total knee.  This was secondary to arthritis.  She states it then it got infected so she was placed on Bactrim.  She did finish the course of Bactrim right before her symptoms started.  She does feel the dizziness and lightheadedness are more when she is moving.  When she lies down it seems to improve.  This has been accompanied by decreased appetite and oral intake.  She states she has had weight loss.  The patient denies any fevers.     Physical Exam   Triage Vital Signs: ED Triage Vitals  Encounter Vitals Group     BP 01/28/23 1613 (!) 184/85     Systolic BP Percentile --      Diastolic BP Percentile --      Pulse Rate 01/28/23 1613 66     Resp 01/28/23 1613 18     Temp 01/28/23 1613 98.6 F (37 C)     Temp Source 01/28/23 1613 Oral     SpO2 01/28/23 1613 96 %     Weight 01/28/23 1605 146 lb (66.2 kg)     Height 01/28/23 1605 5\' 2"  (1.575 m)     Head Circumference --      Peak Flow --      Pain Score 01/28/23 1605 0     Pain Loc --      Pain Education --      Exclude from Growth Chart --     Most recent vital signs: Vitals:   01/28/23 1613  BP: (!) 184/85  Pulse: 66  Resp: 18  Temp: 98.6 F (37 C)  SpO2: 96%   General: Awake, alert, oriented. CV:  Good peripheral perfusion. Regular rate and rhythm. Resp:  Normal effort. Lungs clear. Abd:  No distention.  Other:  PERRL. EOMI. Tongue midline. Face symmetric. Strength 5/5 in upper and lower extremities. Sensation intact.    ED Results / Procedures / Treatments   Labs (all labs ordered  are listed, but only abnormal results are displayed) Labs Reviewed  BASIC METABOLIC PANEL - Abnormal; Notable for the following components:      Result Value   Sodium 134 (*)    Glucose, Bld 103 (*)    BUN 33 (*)    Creatinine, Ser 1.58 (*)    Calcium 14.4 (*)    GFR, Estimated 34 (*)    All other components within normal limits  URINALYSIS, ROUTINE W REFLEX MICROSCOPIC - Abnormal; Notable for the following components:   Color, Urine AMBER (*)    APPearance CLOUDY (*)    All other components within normal limits  HEPATIC FUNCTION PANEL - Abnormal; Notable for the following components:   AST 104 (*)    ALT 163 (*)    Alkaline Phosphatase 137 (*)    All other components within normal limits  BASIC METABOLIC PANEL - Abnormal; Notable for the following components:   BUN 32 (*)    Creatinine, Ser 1.57 (*)    Calcium 13.0 (*)    GFR, Estimated 34 (*)  All other components within normal limits  SARS CORONAVIRUS 2 BY RT PCR  CBC  TSH  TROPONIN I (HIGH SENSITIVITY)  TROPONIN I (HIGH SENSITIVITY)     EKG  I, Phineas Semen, attending physician, personally viewed and interpreted this EKG  EKG Time: 1607 Rate: 63 Rhythm: normal sinus rhythm Axis: left axis deviation Intervals: qtc 337 QRS: LAFB ST changes: no st elevation Impression: abnormal ekg    RADIOLOGY None  PROCEDURES:  Critical Care performed: No   MEDICATIONS ORDERED IN ED: Medications - No data to display   IMPRESSION / MDM / ASSESSMENT AND PLAN / ED COURSE  I reviewed the triage vital signs and the nursing notes.                              Differential diagnosis includes, but is not limited to, anemia, electrolyte abnormality, arrythmia, infection, CVA, vertigo, covid  Patient's presentation is most consistent with acute presentation with potential threat to life or bodily function.   The patient is on the cardiac monitor to evaluate for evidence of arrhythmia and/or significant heart rate  changes.  Patient presented to the emergency department today because of concerns for weakness, lightheadedness, dizziness and decreased appetite.  On exam patient is awake and alert.  No focal neurodeficits.  Patient is afebrile.  Will check blood work.    Blood work was notable for hypercalcemia.  Per chart review patient's calcium was normal couple months ago.  This was checked a again and continued to be elevated.  I do think this could explain many of the patient's symptoms.  Patient was started on IV fluids to start bringing down the calcium.  Given kidney function did not give zoledronic acid and patient did not meet criteria for calcitonin. Discussed with Dr. Para March with the hospitalist service who will plan on admission.     FINAL CLINICAL IMPRESSION(S) / ED DIAGNOSES   Final diagnoses:  Weakness  Hypercalcemia     Note:  This document was prepared using Dragon voice recognition software and may include unintentional dictation errors.    Phineas Semen, MD 01/28/23 2046

## 2023-01-28 NOTE — Assessment & Plan Note (Addendum)
Patient lost 10 pounds in the past couple weeks likely due to poor oral intake and GI illness a couple weeks prior however with hypercalcemia, might consider malignancy workup if over will symptoms not improving with hydration

## 2023-01-28 NOTE — Progress Notes (Signed)
Anticoagulation monitoring(Lovenox):  77 yo female ordered Lovenox 40 mg Q24h    Filed Weights   01/28/23 1605  Weight: 66.2 kg (146 lb)   BMI 26.7    Lab Results  Component Value Date   CREATININE 1.57 (H) 01/28/2023   CREATININE 1.58 (H) 01/28/2023   CREATININE 0.83 12/07/2022   Estimated Creatinine Clearance: 27.6 mL/min (A) (by C-G formula based on SCr of 1.57 mg/dL (H)). Hemoglobin & Hematocrit     Component Value Date/Time   HGB 13.6 01/28/2023 1609   HCT 40.3 01/28/2023 1609     Per Protocol for Patient with estCrcl < 30 ml/min and BMI < 30, will transition to Lovenox 30 mg Q24h.

## 2023-01-28 NOTE — Assessment & Plan Note (Signed)
Will get A1c and twice daily fingersticks Consistent carbohydrate diet

## 2023-01-28 NOTE — Assessment & Plan Note (Addendum)
Suspect dehydration as well as medication related ATN/possible AIN Hold Celebrex and recently completed a course of Bactrim Avoid all nephrotoxins IV hydration and monitor renal function Can consider nephrology consult if not improving with hydration

## 2023-01-28 NOTE — Assessment & Plan Note (Addendum)
Blood pressure somewhat uncontrolled Started on oral hydralazine 3 times daily

## 2023-01-28 NOTE — Assessment & Plan Note (Signed)
Suspect causing both abnormal renal function and abnormal liver function Patient no longer on Bactrim which she completed 3 days prior

## 2023-01-28 NOTE — ED Triage Notes (Addendum)
Pt c/o dizziness x2 days that is constant and not positional. Pt denies weakness, SHOB, CP, N/V/D. Pt states she had a rt knee replacement 6 weeks ago that was treated for infection and is taking Celebrex since surgery. Pt has history of MI and covid booster adverse reaction.

## 2023-01-28 NOTE — Assessment & Plan Note (Signed)
Continue levothyroxine 

## 2023-01-29 DIAGNOSIS — D869 Sarcoidosis, unspecified: Secondary | ICD-10-CM | POA: Diagnosis not present

## 2023-01-29 DIAGNOSIS — Z803 Family history of malignant neoplasm of breast: Secondary | ICD-10-CM | POA: Diagnosis not present

## 2023-01-29 DIAGNOSIS — I10 Essential (primary) hypertension: Secondary | ICD-10-CM

## 2023-01-29 DIAGNOSIS — Z8616 Personal history of COVID-19: Secondary | ICD-10-CM | POA: Diagnosis not present

## 2023-01-29 DIAGNOSIS — R531 Weakness: Secondary | ICD-10-CM | POA: Diagnosis not present

## 2023-01-29 DIAGNOSIS — Z96651 Presence of right artificial knee joint: Secondary | ICD-10-CM

## 2023-01-29 DIAGNOSIS — Z87891 Personal history of nicotine dependence: Secondary | ICD-10-CM | POA: Diagnosis not present

## 2023-01-29 DIAGNOSIS — K760 Fatty (change of) liver, not elsewhere classified: Secondary | ICD-10-CM | POA: Diagnosis not present

## 2023-01-29 DIAGNOSIS — R42 Dizziness and giddiness: Secondary | ICD-10-CM | POA: Diagnosis not present

## 2023-01-29 DIAGNOSIS — Z85828 Personal history of other malignant neoplasm of skin: Secondary | ICD-10-CM | POA: Diagnosis not present

## 2023-01-29 DIAGNOSIS — M1711 Unilateral primary osteoarthritis, right knee: Secondary | ICD-10-CM | POA: Diagnosis not present

## 2023-01-29 DIAGNOSIS — G4733 Obstructive sleep apnea (adult) (pediatric): Secondary | ICD-10-CM | POA: Diagnosis not present

## 2023-01-29 DIAGNOSIS — T368X5A Adverse effect of other systemic antibiotics, initial encounter: Secondary | ICD-10-CM

## 2023-01-29 DIAGNOSIS — N179 Acute kidney failure, unspecified: Secondary | ICD-10-CM

## 2023-01-29 DIAGNOSIS — Z79890 Hormone replacement therapy (postmenopausal): Secondary | ICD-10-CM | POA: Diagnosis not present

## 2023-01-29 DIAGNOSIS — E860 Dehydration: Secondary | ICD-10-CM | POA: Diagnosis not present

## 2023-01-29 DIAGNOSIS — Z1152 Encounter for screening for COVID-19: Secondary | ICD-10-CM | POA: Diagnosis not present

## 2023-01-29 DIAGNOSIS — K8689 Other specified diseases of pancreas: Secondary | ICD-10-CM | POA: Diagnosis not present

## 2023-01-29 DIAGNOSIS — E039 Hypothyroidism, unspecified: Secondary | ICD-10-CM

## 2023-01-29 DIAGNOSIS — I2510 Atherosclerotic heart disease of native coronary artery without angina pectoris: Secondary | ICD-10-CM | POA: Diagnosis not present

## 2023-01-29 DIAGNOSIS — R748 Abnormal levels of other serum enzymes: Secondary | ICD-10-CM | POA: Diagnosis not present

## 2023-01-29 DIAGNOSIS — T8141XD Infection following a procedure, superficial incisional surgical site, subsequent encounter: Secondary | ICD-10-CM | POA: Diagnosis not present

## 2023-01-29 DIAGNOSIS — R7401 Elevation of levels of liver transaminase levels: Secondary | ICD-10-CM | POA: Diagnosis not present

## 2023-01-29 DIAGNOSIS — J45909 Unspecified asthma, uncomplicated: Secondary | ICD-10-CM | POA: Diagnosis not present

## 2023-01-29 DIAGNOSIS — Z808 Family history of malignant neoplasm of other organs or systems: Secondary | ICD-10-CM | POA: Diagnosis not present

## 2023-01-29 DIAGNOSIS — I252 Old myocardial infarction: Secondary | ICD-10-CM | POA: Diagnosis not present

## 2023-01-29 DIAGNOSIS — R634 Abnormal weight loss: Secondary | ICD-10-CM | POA: Diagnosis not present

## 2023-01-29 DIAGNOSIS — R7303 Prediabetes: Secondary | ICD-10-CM | POA: Diagnosis not present

## 2023-01-29 DIAGNOSIS — Z8249 Family history of ischemic heart disease and other diseases of the circulatory system: Secondary | ICD-10-CM | POA: Diagnosis not present

## 2023-01-29 LAB — COMPREHENSIVE METABOLIC PANEL
ALT: 118 U/L — ABNORMAL HIGH (ref 0–44)
AST: 74 U/L — ABNORMAL HIGH (ref 15–41)
Albumin: 2.7 g/dL — ABNORMAL LOW (ref 3.5–5.0)
Alkaline Phosphatase: 103 U/L (ref 38–126)
Anion gap: 6 (ref 5–15)
BUN: 28 mg/dL — ABNORMAL HIGH (ref 8–23)
CO2: 25 mmol/L (ref 22–32)
Calcium: 12.5 mg/dL — ABNORMAL HIGH (ref 8.9–10.3)
Chloride: 108 mmol/L (ref 98–111)
Creatinine, Ser: 1.44 mg/dL — ABNORMAL HIGH (ref 0.44–1.00)
GFR, Estimated: 38 mL/min — ABNORMAL LOW (ref >60)
Glucose, Bld: 105 mg/dL — ABNORMAL HIGH (ref 70–99)
Potassium: 4.2 mmol/L (ref 3.5–5.1)
Sodium: 139 mmol/L (ref 135–145)
Total Bilirubin: 0.4 mg/dL (ref 0.3–1.2)
Total Protein: 5.9 g/dL — ABNORMAL LOW (ref 6.5–8.1)

## 2023-01-29 LAB — CBC
HCT: 33 % — ABNORMAL LOW (ref 36.0–46.0)
Hemoglobin: 11.1 g/dL — ABNORMAL LOW (ref 12.0–15.0)
MCH: 32.3 pg (ref 26.0–34.0)
MCHC: 33.6 g/dL (ref 30.0–36.0)
MCV: 95.9 fL (ref 80.0–100.0)
Platelets: 295 10*3/uL (ref 150–400)
RBC: 3.44 MIL/uL — ABNORMAL LOW (ref 3.87–5.11)
RDW: 12.3 % (ref 11.5–15.5)
WBC: 5.6 10*3/uL (ref 4.0–10.5)
nRBC: 0 % (ref 0.0–0.2)

## 2023-01-29 LAB — CBG MONITORING, ED: Glucose-Capillary: 88 mg/dL (ref 70–99)

## 2023-01-29 LAB — GLUCOSE, CAPILLARY
Glucose-Capillary: 70 mg/dL (ref 70–99)
Glucose-Capillary: 80 mg/dL (ref 70–99)

## 2023-01-29 LAB — HEMOGLOBIN A1C
Hgb A1c MFr Bld: 5.5 % (ref 4.8–5.6)
Mean Plasma Glucose: 111.15 mg/dL

## 2023-01-29 MED ORDER — SODIUM CHLORIDE 0.9 % IV SOLN: INTRAVENOUS | Status: DC

## 2023-01-29 NOTE — Progress Notes (Signed)
Progress Note   Patient: Bridget Howe ZOX:[id number] DOB: August 16, 1945 DOA: 01/28/2023     0 DOS: the patient was seen and examined on 01/29/2023   Brief hospital course: Bridget Howe is a 77 y.o. 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 She also feels like her vision is blurred.  She has associated nausea.  She denies headache,  one-sided weakness numbness or tingling, slurred speech or difficulty swallowing.  Denies chest pain or shortness of breath or palpitations.  Denies fever or chills. States that 2 weeks ago when she developed the infection in her knee she also had a 3-day bout of diarrhea.  Since that time she has had decreased oral intake and states she has lost up to 10 pounds.  Her wound infection cleared up with the Bactrim and she has since followed up with orthopedics. ED course and data review: BP 184/85 with heart rate in the 50s and 60s and otherwise normal vitals Labs: Notable for normal CBC, CMP notable for creatinine of 1.57 which is up from baseline of 0.83 a month prior.  Calcium noted to be elevated at 14.4 up from 9.3 a month prior.  Also has elevated liver enzymes with AST 104, ALT 163 and alk phos 137. TSH normal COVID-negative UA unremarkable EKG, personally viewed and interpreted showing NSR at 63 with no acute ST-T wave changes. MRI brain unremarkable. Patient is admitted to the hospitalist service for further management evaluation of vertigo, dehydration, elevated LFTs, hypercalcemia.  Assessment and Plan: * Vertigo In the setting of dehydration.  Will continue gentle IV hydration. Encourage oral diet, supplements. PT evaluation, out of bed to  chair.  Hypercalcemia Continue IV hydration. Ca improved to 12. Will get ionized calcium, follow-up PTH, vitamin D3 levels Patient is dehydrated and also on vitamin D supplements which may increase calcium level.  AKI (acute kidney injury) (HCC) In the setting of poor oral intake, dehydration and bactrim use.   Stop Celebrex. Avoid all nephrotoxic drugs. Gentle IV hydration. Monitor daily renal function.  Unintentional weight loss Patient lost 10 pounds in the past couple weeks likely due to poor oral intake and GI illness a couple weeks prior however with hypercalcemia, might consider malignancy workup if over will symptoms not improving with hydration.  Elevated liver enzymes Possibly related to adverse effect of Bactrim, Tylenol for pain IV hydration and monitor Liver functions improving with IV hydration. Trend LFTs.  Adverse effect of sulfamethoxazole/trimethoprim Suspect causing both abnormal renal function and abnormal liver function Patient no longer on Bactrim, completed 3 days ago.  Prediabetes A1c 5.5. Consistent carbohydrate diet.  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 Recently followed up with Ortho -no further antibiotics or surgical intervention needed.  HTN (hypertension) Blood pressure elevated. Started on oral hydralazine 3 times daily  Coronary artery disease Continue aspirin. History of an MI at age 51  OSA on CPAP CPAP nightly  Hypothyroid Continue levothyroxine      Out of bed to chair. Incentive spirometry. Nursing supportive care. Fall, aspiration precautions. DVT prophylaxis   Code Status: Full Code  Subjective: Patient is seen and examined today morning.  She is lying comfortably.  Denies any dizziness or lightheadedness.  Encouraged out of bed to chair, work with PT.  Has poor appetite.  Physical Exam: Vitals:   01/29/23 0825  01/29/23 0830 01/29/23 0941 01/29/23 1153  BP:   (!) 158/80 (!) 152/68  Pulse:  62 64 (!) 50  Resp:  18    Temp: 98.5 F (36.9 C)  98.5 F (36.9 C) 98.5 F (36.9 C)  TempSrc: Oral  Oral   SpO2:  93% 93% 95%  Weight:    68.3 kg  Height:        General - Elderly Caucasian female, no apparent distress HEENT - PERRLA, EOMI, atraumatic head, non tender sinuses. Lung - Clear, bibasal rales, rhonchi, wheezes. Heart - S1, S2 heard, no murmurs, rubs, trace pedal edema. Abdomen - Soft, non tender, non guarding, bowel sounds good Neuro - Alert, awake and oriented x 3, non focal exam. Skin - Warm and dry. Right knee surgical scar, mild redness.  Data Reviewed:      Latest Ref Rng & Units 01/29/2023    4:11 AM 01/28/2023    4:09 PM 12/07/2022    9:19 AM  CBC  WBC 4.0 - 10.5 K/uL 5.6  7.8  5.4   Hemoglobin 12.0 - 15.0 g/dL 54.0  98.1  19.1   Hematocrit 36.0 - 46.0 % 33.0  40.3  44.5   Platelets 150 - 400 K/uL 295  372  281       Latest Ref Rng & Units 01/29/2023    4:11 AM 01/28/2023    6:14 PM 01/28/2023    4:09 PM  BMP  Glucose 70 - 99 mg/dL 478  95  295   BUN 8 - 23 mg/dL 28  32  33   Creatinine 0.44 - 1.00 mg/dL 6.21  3.08  6.57   Sodium 135 - 145 mmol/L 139  135  134   Potassium 3.5 - 5.1 mmol/L 4.2  4.6  4.6   Chloride 98 - 111 mmol/L 108  101  98   CO2 22 - 32 mmol/L 25  27  27    Calcium 8.9 - 10.3 mg/dL 84.6  96.2  95.2    MR BRAIN WO CONTRAST  Result Date: 01/28/2023 CLINICAL DATA:  Dizziness over the last 2 days which is constant. EXAM: MRI HEAD WITHOUT CONTRAST TECHNIQUE: Multiplanar, multiecho pulse sequences of the brain and surrounding structures were obtained without intravenous contrast. COMPARISON:  10/10/2014 FINDINGS: Brain: Diffusion imaging does not show any acute or subacute infarction. There are mild chronic small-vessel ischemic changes of the pons. 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 Cerebral hemispheres show minimal  chronic small-vessel change of the white matter. No cortical or large vessel territory infarction. No mass lesion, hemorrhage, hydrocephalus or extra-axial collection. Vascular: Major vessels at the base of the brain show flow. Skull and upper cervical spine: Negative Sinuses/Orbits: Clear/normal Other: No fluid in the middle ears or mastoid air cells. IMPRESSION: No acute or reversible finding. Mild chronic small-vessel ischemic changes of the pons and cerebellum. Minimal chronic small-vessel change of the cerebral hemispheric white matter. Electronically Signed   By: Paulina Fusi M.D.   On: 01/28/2023 21:01     Family Communication: Discussed with patient, she understands and agrees. All questions answereed.  Disposition: Status is: Observation The patient will require care spanning > 2 midnights and should be moved to inpatient because: Hypercalcemia, IV hydration, PT eval  Planned Discharge Destination: Home  Time spent: 39 minutes  Author: Marcelino Duster, MD 01/29/2023 2:54 PM Secure chat 7am to 7pm For on call review [email or web address].

## 2023-01-29 NOTE — Plan of Care (Signed)
  Problem: Education: Goal: Knowledge of the prescribed therapeutic regimen will improve Outcome: Progressing Goal: Individualized Educational Video(s) Outcome: Progressing   Problem: Activity: Goal: Ability to avoid complications of mobility impairment will improve Outcome: Progressing Goal: Range of joint motion will improve Outcome: Progressing   Problem: Clinical Measurements: Goal: Postoperative complications will be avoided or minimized Outcome: Progressing   Problem: Pain Management: Goal: Pain level will decrease with appropriate interventions Outcome: Progressing   Problem: Skin Integrity: Goal: Will show signs of wound healing Outcome: Progressing   Problem: Education: Goal: Ability to describe self-care measures that may prevent or decrease complications (Diabetes Survival Skills Education) will improve Outcome: Progressing Goal: Individualized Educational Video(s) Outcome: Progressing   Problem: Coping: Goal: Ability to adjust to condition or change in health will improve Outcome: Progressing   Problem: Fluid Volume: Goal: Ability to maintain a balanced intake and output will improve Outcome: Progressing   Problem: Health Behavior/Discharge Planning: Goal: Ability to identify and utilize available resources and services will improve Outcome: Progressing Goal: Ability to manage health-related needs will improve Outcome: Progressing   Problem: Metabolic: Goal: Ability to maintain appropriate glucose levels will improve Outcome: Progressing   Problem: Nutritional: Goal: Maintenance of adequate nutrition will improve Outcome: Progressing Goal: Progress toward achieving an optimal weight will improve Outcome: Progressing   Problem: Skin Integrity: Goal: Risk for impaired skin integrity will decrease Outcome: Progressing   Problem: Tissue Perfusion: Goal: Adequacy of tissue perfusion will improve Outcome: Progressing   Problem: Education: Goal:  Knowledge of General Education information will improve Description: Including pain rating scale, medication(s)/side effects and non-pharmacologic comfort measures Outcome: Progressing   Problem: Health Behavior/Discharge Planning: Goal: Ability to manage health-related needs will improve Outcome: Progressing   Problem: Clinical Measurements: Goal: Ability to maintain clinical measurements within normal limits will improve Outcome: Progressing Goal: Will remain free from infection Outcome: Progressing Goal: Diagnostic test results will improve Outcome: Progressing Goal: Respiratory complications will improve Outcome: Progressing Goal: Cardiovascular complication will be avoided Outcome: Progressing   Problem: Activity: Goal: Risk for activity intolerance will decrease Outcome: Progressing   Problem: Nutrition: Goal: Adequate nutrition will be maintained Outcome: Progressing   Problem: Coping: Goal: Level of anxiety will decrease Outcome: Progressing   Problem: Elimination: Goal: Will not experience complications related to bowel motility Outcome: Progressing Goal: Will not experience complications related to urinary retention Outcome: Progressing   Problem: Pain Managment: Goal: General experience of comfort will improve Outcome: Progressing   Problem: Safety: Goal: Ability to remain free from injury will improve Outcome: Progressing   Problem: Skin Integrity: Goal: Risk for impaired skin integrity will decrease Outcome: Progressing   

## 2023-01-29 NOTE — ED Notes (Signed)
Tech to take pt up to admitted room.

## 2023-01-29 NOTE — ED Notes (Signed)
Pt has breakfast tray . ?

## 2023-01-30 DIAGNOSIS — R42 Dizziness and giddiness: Secondary | ICD-10-CM | POA: Diagnosis not present

## 2023-01-30 LAB — COMPREHENSIVE METABOLIC PANEL
ALT: 123 U/L — ABNORMAL HIGH (ref 0–44)
AST: 73 U/L — ABNORMAL HIGH (ref 15–41)
Albumin: 3.1 g/dL — ABNORMAL LOW (ref 3.5–5.0)
Alkaline Phosphatase: 117 U/L (ref 38–126)
Anion gap: 7 (ref 5–15)
BUN: 30 mg/dL — ABNORMAL HIGH (ref 8–23)
CO2: 28 mmol/L (ref 22–32)
Calcium: 12.5 mg/dL — ABNORMAL HIGH (ref 8.9–10.3)
Chloride: 106 mmol/L (ref 98–111)
Creatinine, Ser: 1.32 mg/dL — ABNORMAL HIGH (ref 0.44–1.00)
GFR, Estimated: 42 mL/min — ABNORMAL LOW (ref >60)
Glucose, Bld: 113 mg/dL — ABNORMAL HIGH (ref 70–99)
Potassium: 4.3 mmol/L (ref 3.5–5.1)
Sodium: 141 mmol/L (ref 135–145)
Total Bilirubin: 0.4 mg/dL (ref 0.3–1.2)
Total Protein: 6.6 g/dL (ref 6.5–8.1)

## 2023-01-30 LAB — TROPONIN I (HIGH SENSITIVITY)
Troponin I (High Sensitivity): 9 ng/L (ref <18)
Troponin I (High Sensitivity): 12 ng/L (ref <18)

## 2023-01-30 LAB — GLUCOSE, CAPILLARY
Glucose-Capillary: 94 mg/dL (ref 70–99)
Glucose-Capillary: 123 mg/dL — ABNORMAL HIGH (ref 70–99)
Glucose-Capillary: 116 mg/dL — ABNORMAL HIGH (ref 70–99)
Glucose-Capillary: 137 mg/dL — ABNORMAL HIGH (ref 70–99)

## 2023-01-30 LAB — CALCITRIOL (1,25 DI-OH VIT D): Vit D, 1,25-Dihydroxy: 128 pg/mL — ABNORMAL HIGH (ref 24.8–81.5)

## 2023-01-30 LAB — CALCIUM, IONIZED: Calcium, Ionized, Serum: 7.4 mg/dL — ABNORMAL HIGH (ref 4.5–5.6)

## 2023-01-30 MED ORDER — AMLODIPINE BESYLATE 10 MG PO TABS
10.0000 mg | ORAL_TABLET | Freq: Every day | ORAL | Status: DC
  Administered 2023-01-31 – 2023-02-04 (×5): 10 mg via ORAL
  Filled 2023-01-30 (×5): qty 1

## 2023-01-30 MED ORDER — FUROSEMIDE 10 MG/ML IJ SOLN
40.0000 mg | Freq: Once | INTRAMUSCULAR | Status: AC
  Administered 2023-01-30: 40 mg via INTRAVENOUS
  Filled 2023-01-30: qty 4

## 2023-01-30 MED ORDER — AMLODIPINE BESYLATE 5 MG PO TABS
5.0000 mg | ORAL_TABLET | Freq: Every day | ORAL | Status: DC
  Administered 2023-01-30: 5 mg via ORAL
  Filled 2023-01-30: qty 1

## 2023-01-30 MED ORDER — ADULT MULTIVITAMIN W/MINERALS CH
1.0000 | ORAL_TABLET | Freq: Every day | ORAL | Status: DC
  Administered 2023-01-30 – 2023-02-04 (×6): 1 via ORAL
  Filled 2023-01-30 (×6): qty 1

## 2023-01-30 MED ORDER — ENOXAPARIN SODIUM 40 MG/0.4ML IJ SOSY
40.0000 mg | PREFILLED_SYRINGE | INTRAMUSCULAR | Status: DC
  Administered 2023-01-30 – 2023-02-03 (×5): 40 mg via SUBCUTANEOUS
  Filled 2023-01-30 (×5): qty 0.4

## 2023-01-30 MED ORDER — HYDRALAZINE HCL 20 MG/ML IJ SOLN
5.0000 mg | Freq: Four times a day (QID) | INTRAMUSCULAR | Status: DC | PRN
  Administered 2023-01-30: 5 mg via INTRAVENOUS
  Filled 2023-01-30: qty 1

## 2023-01-30 MED ORDER — SODIUM CHLORIDE 0.9 % IV SOLN: INTRAVENOUS | Status: DC

## 2023-01-30 MED ORDER — AMLODIPINE BESYLATE 5 MG PO TABS
5.0000 mg | ORAL_TABLET | Freq: Once | ORAL | Status: AC
  Administered 2023-01-30: 5 mg via ORAL
  Filled 2023-01-30: qty 1

## 2023-01-30 MED ORDER — ENSURE ENLIVE PO LIQD
237.0000 mL | Freq: Three times a day (TID) | ORAL | Status: DC
  Administered 2023-01-30 – 2023-02-01 (×4): 237 mL via ORAL

## 2023-01-30 NOTE — Progress Notes (Signed)
   01/30/23 0939  Vitals  BP (!) 161/75  MAP (mmHg) 97  Pulse Rate 76  MEWS COLOR  MEWS Score Color Green  Oxygen Therapy  SpO2 100 %  O2 Device Room Air  MEWS Score  MEWS Temp 0  MEWS Systolic 0  MEWS Pulse 0  MEWS RR 1  MEWS LOC 0  MEWS Score 1   Called into the room by patient. Patient sitting up in bed. Reports that she had gotten up to the bathroom and felt really off balance. IV lasix and hydralazine were given at 0830 this morning. Vital signs were taken (as shown above) none significant from previous. Patient reports that she now feels fine. Instructed patient to call for assistance to get to/from the restroom if she feels off balance, also to call if any of her symptoms return. MD. Agbata notified.

## 2023-01-30 NOTE — Progress Notes (Signed)
Initial Nutrition Assessment  DOCUMENTATION CODES:   Not applicable  INTERVENTION:   -Liberalize diet to regular for widest variety of meal selections -MVI with minerals daily -Ensure Enlive po TID, each supplement provides 350 kcal and 20 grams of protein   NUTRITION DIAGNOSIS:   Increased nutrient needs related to acute illness as evidenced by estimated needs.  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  Pt admitted with vertigo, hypercalcemia, and AKI.   Reviewed I/O's: +3.9 L x 24 hours  UOP: 2 ml x 24 hours  Spoke with pt at bedside, who was pleasant and in good spirits today. She reports feeling unwell due to poor sleep, low blood pressure, and lasix. Pt shares that she was in her usual state of  health until two weeks ago, when she underwent a knee replacement, which caused an infection and has been having diarrhea and poor oral intake since being on antibiotics. Pt reports eating bites and sips due to feeling unwell, but was able to eat a good dinner yesterday. Pt consumed only a bowl of cereal this morning.   Prior to acute illness, pt with good appetite. She typically consumes 3 meals per day (Breakfast: oatmeal with fruit; Lunch: flatbread sandwich with fruit; Dinner: lentil soup). 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 Pt shares that she has been ongoing since childhood and has learned to manage it and does best with softer  foods like cottage cheese and yogurt. RD offered to downgrade diet, however, pt would like to pick and choose foods on the menu and does not want diet further restricted.   Pt estimates she has lost 10 pounds over the past two weeks. Reviewed wt hx; wt has been stable over the past 2 months.   Discussed importance of good meal and supplement intake to promote healing. Pt amenable to supplements.   Medications reviewed and include 0.98% sodium chloride infusion @ 100 ml/hr.   Lab Results  Component Value Date   HGBA1C 5.5 01/29/2023   PTA DM medications are none.   Labs reviewed: CBGS: 70-94 (inpatient orders for glycemic control are 0-15 units insulin aspart TID with meals).    NUTRITION - FOCUSED PHYSICAL EXAM:  Flowsheet Row Most Recent Value  Orbital Region No depletion  Upper Arm Region No depletion  Thoracic and Lumbar Region No depletion  Buccal Region No depletion  Temple Region No depletion  Clavicle Bone Region No depletion  Clavicle and Acromion Bone Region No depletion  Scapular Bone Region No depletion  Dorsal Hand No depletion  Patellar Region No depletion  Anterior Thigh Region No depletion  Posterior Calf Region No depletion  Edema (RD Assessment) None  Hair Reviewed  Eyes Reviewed  Mouth Reviewed  Skin Reviewed  Nails Reviewed       Diet Order:   Diet Order             Diet regular Fluid consistency: Thin  Diet effective now  EDUCATION NEEDS:   Education needs have been addressed  Skin:  Skin Assessment: Reviewed RN Assessment  Last BM:  01/28/23  Height:   Ht Readings from Last 1 Encounters:  01/28/23 5\' 2"  (1.575 m)    Weight:   Wt Readings from Last 1 Encounters:  01/30/23 68.9 kg    Ideal Body Weight:  50 kg  BMI:  Body mass index is 27.78 kg/m.  Estimated Nutritional Needs:   Kcal:  1700-1900  Protein:  90-105 grams  Fluid:  > 1.7 L    Bridget Howe, RD, LDN, CDCES Registered Dietitian  III Certified Diabetes Care and Education Specialist Please refer to [facility] for RD and/or RD on-call/weekend/after hours pager

## 2023-01-30 NOTE — Progress Notes (Signed)
Progress Note   Patient: Bridget Howe:[id number] DOB: Sep 02, 1945 DOA: 01/28/2023     1 DOS: the patient was seen and examined on 01/30/2023   Brief hospital course:  Bridget Howe is a 77 y.o. 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 She also feels like her vision is blurred.  She has associated nausea.  She denies headache,  one-sided weakness numbness or tingling, slurred speech or difficulty swallowing.  Denies chest pain or shortness of breath or palpitations.  Denies fever or chills. States that 2 weeks ago when she developed the infection in her knee she also had a 3-day bout of diarrhea.  Since that time she has had decreased oral intake and states she has lost up to 10 pounds.  Her wound infection cleared up with the Bactrim and she has since followed up with orthopedics. ED course and data review: BP 184/85 with heart rate in the 50s and 60s and otherwise normal vitals Labs: Notable for normal CBC, CMP notable for creatinine of 1.57 which is up from baseline of 0.83 a month prior.  Calcium noted to be elevated at 14.4 up from 9.3 a month prior.  Also has elevated liver enzymes with AST 104, ALT 163 and alk phos 137. TSH normal COVID-negative UA unremarkable EKG, personally viewed and interpreted showing NSR at 63 with no acute ST-T wave changes. MRI brain unremarkable. Patient is admitted to the hospitalist service for further management evaluation of vertigo, dehydration, elevated LFTs, hypercalcemia.     Assessment and Plan:  * Vertigo In the setting of dehydration. Improved Will continue gentle IV hydration. Encourage oral diet, supplements. PT evaluation, out of bed to chair.     Hypercalcemia Unclear etiology PTH is pending Vitamin D, 1, 25 dihydroxy level is elevated at 128 Continue IV hydration.  Will give a dose of Lasix 40 mg IV Continue IV fluid hydration Patient is on vitamin D supplements which may increase calcium level.    AKI (acute kidney injury) (HCC) In the setting of poor oral intake, dehydration and bactrim use.   Baseline serum creatinine is 0.8 and on admission it was 1.5 Stop Celebrex. Avoid all nephrotoxic drugs. Continue IV hydration. Monitor daily renal function.    Unintentional weight loss Patient lost 10 pounds in the past couple weeks likely due to poor oral intake and GI illness a couple weeks prior however with hypercalcemia, might consider malignancy workup if over will symptoms not improving with hydration.    Elevated liver enzymes Possibly related to adverse effect of Bactrim, Tylenol for pain IV hydration and monitor Liver functions improving with IV hydration. Trend LFTs.   Adverse effect of sulfamethoxazole/trimethoprim Suspect causing both abnormal renal function and abnormal liver function Patient no longer on Bactrim, completed 3 days ago.   Prediabetes A1c 5.5. Consistent carbohydrate diet.   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 Recently followed up with Ortho -no further antibiotics or surgical intervention needed.   HTN (hypertension) Uncontrolled Add amlodipine 5 mg daily and uptitrate to optimize blood pressure control Continue hydralazine 3 times daily   Coronary artery disease Continue aspirin. History of an MI at age 22  OSA on CPAP CPAP nightly   Hypothyroid Continue levothyroxine   Exertional chest pain Improves at rest Obtain twelve-lead EKG to rule out an acute coronary syndrome. (Twelve-lead EKG reviewed and shows nonspecific T wave changes, no acute ST or T wave changes) Initial  troponin is negative      Subjective: Patient is seen and examined at bedside.  Physical Exam: Vitals:   01/30/23 0413 01/30/23 0828 01/30/23 0939 01/30/23 1222  BP: (!) 185/79 (!) 185/80 (!) 161/75 (!) 151/75  Pulse: (!) 59 63 76 73  Resp: 18 (!) 22  (!) 22  Temp: 97.8 F (36.6 C) 98 F (36.7 C)  98.1 F (36.7 C)  TempSrc: Oral     SpO2: 99% 95% 100% 99%  Weight: 68.9 kg     Height:       General - Elderly Caucasian female, no apparent distress HEENT - PERRLA, EOMI, atraumatic head, non tender sinuses. Lung - CTAB/L Heart - S1, S2 heard, no murmurs, rubs, trace pedal edema. Abdomen - Soft, non tender, non guarding, bowel sounds good Neuro - Alert, awake and oriented x 3, non focal exam. Skin - Warm and dry. Right knee surgical scar, mild redness.  Data Reviewed: Labs reviewed.  Transaminitis, calcium 14.4 >> 12.5 There are no new results to review at this time.  Family Communication: Plan of care discussed with patient  Disposition: Status is: Inpatient Remains inpatient appropriate because: Requires IV fluid hydration for hypercalcemia  Planned Discharge Destination: Home with Home Health    Time spent: 35 minutes  Author: Lucile Shutters, MD 01/30/2023 2:46 PM  For on call review [email or web address].

## 2023-01-30 NOTE — Progress Notes (Signed)
Transition of Care [facility]) - Inpatient Brief Assessment   Patient Details  Name: Bridget Howe MRN: [id number] Date of Birth: 1946/03/23  Transition of Care [facility]) CM/SW Contact:    Truddie Hidden, RN Phone Number: 01/30/2023, 4:26 PM   Clinical Narrative:  TOC continuing to follow patient's progress throughout discharge planning.   Transition of Care Asessment: Insurance and Status: Insurance coverage has been reviewed Patient has primary care physician: Yes Home environment has been reviewed: home Prior level of function:: independent Prior/Current Home Services: No current home services Social Determinants of Health Reivew: SDOH reviewed no interventions necessary Readmission risk has been reviewed: Yes Transition of care needs: no transition of care needs at this time

## 2023-01-31 ENCOUNTER — Inpatient Hospital Stay: Payer: PPO

## 2023-01-31 DIAGNOSIS — R42 Dizziness and giddiness: Secondary | ICD-10-CM | POA: Diagnosis not present

## 2023-01-31 LAB — BASIC METABOLIC PANEL
Anion gap: 7 (ref 5–15)
BUN: 25 mg/dL — ABNORMAL HIGH (ref 8–23)
CO2: 28 mmol/L (ref 22–32)
Calcium: 12.2 mg/dL — ABNORMAL HIGH (ref 8.9–10.3)
Chloride: 106 mmol/L (ref 98–111)
Creatinine, Ser: 1.27 mg/dL — ABNORMAL HIGH (ref 0.44–1.00)
GFR, Estimated: 44 mL/min — ABNORMAL LOW (ref >60)
Glucose, Bld: 104 mg/dL — ABNORMAL HIGH (ref 70–99)
Potassium: 3.7 mmol/L (ref 3.5–5.1)
Sodium: 141 mmol/L (ref 135–145)

## 2023-01-31 LAB — GLUCOSE, CAPILLARY
Glucose-Capillary: 107 mg/dL — ABNORMAL HIGH (ref 70–99)
Glucose-Capillary: 126 mg/dL — ABNORMAL HIGH (ref 70–99)
Glucose-Capillary: 128 mg/dL — ABNORMAL HIGH (ref 70–99)
Glucose-Capillary: 123 mg/dL — ABNORMAL HIGH (ref 70–99)

## 2023-01-31 LAB — PARATHYROID HORMONE, INTACT (NO CA): PTH: 4 pg/mL — ABNORMAL LOW (ref 15–65)

## 2023-01-31 MED ORDER — ZOLEDRONIC ACID 4 MG/100ML IV SOLN
4.0000 mg | Freq: Once | INTRAVENOUS | Status: DC
  Filled 2023-01-31: qty 100

## 2023-01-31 MED ORDER — ZOLEDRONIC ACID 4 MG/5ML IV CONC
4.0000 mg | Freq: Once | INTRAVENOUS | Status: AC
  Administered 2023-01-31: 4 mg via INTRAVENOUS
  Filled 2023-01-31: qty 5

## 2023-01-31 MED ORDER — SODIUM CHLORIDE 0.9 % IV SOLN: INTRAVENOUS | Status: AC

## 2023-01-31 NOTE — Plan of Care (Signed)
  Problem: Education: Goal: Knowledge of the prescribed therapeutic regimen will improve Outcome: Progressing Goal: Individualized Educational Video(s) Outcome: Progressing   Problem: Activity: Goal: Ability to avoid complications of mobility impairment will improve Outcome: Progressing Goal: Range of joint motion will improve Outcome: Progressing   Problem: Clinical Measurements: Goal: Postoperative complications will be avoided or minimized Outcome: Progressing   Problem: Pain Management: Goal: Pain level will decrease with appropriate interventions Outcome: Progressing   Problem: Skin Integrity: Goal: Will show signs of wound healing Outcome: Progressing   Problem: Education: Goal: Ability to describe self-care measures that may prevent or decrease complications (Diabetes Survival Skills Education) will improve Outcome: Progressing Goal: Individualized Educational Video(s) Outcome: Progressing   Problem: Coping: Goal: Ability to adjust to condition or change in health will improve Outcome: Progressing   Problem: Fluid Volume: Goal: Ability to maintain a balanced intake and output will improve Outcome: Progressing   Problem: Health Behavior/Discharge Planning: Goal: Ability to identify and utilize available resources and services will improve Outcome: Progressing Goal: Ability to manage health-related needs will improve Outcome: Progressing   Problem: Metabolic: Goal: Ability to maintain appropriate glucose levels will improve Outcome: Progressing   Problem: Nutritional: Goal: Maintenance of adequate nutrition will improve Outcome: Progressing Goal: Progress toward achieving an optimal weight will improve Outcome: Progressing   Problem: Skin Integrity: Goal: Risk for impaired skin integrity will decrease Outcome: Progressing   Problem: Tissue Perfusion: Goal: Adequacy of tissue perfusion will improve Outcome: Progressing   Problem: Education: Goal:  Knowledge of General Education information will improve Description: Including pain rating scale, medication(s)/side effects and non-pharmacologic comfort measures Outcome: Progressing   Problem: Health Behavior/Discharge Planning: Goal: Ability to manage health-related needs will improve Outcome: Progressing   Problem: Clinical Measurements: Goal: Ability to maintain clinical measurements within normal limits will improve Outcome: Progressing Goal: Will remain free from infection Outcome: Progressing Goal: Diagnostic test results will improve Outcome: Progressing Goal: Respiratory complications will improve Outcome: Progressing Goal: Cardiovascular complication will be avoided Outcome: Progressing   Problem: Activity: Goal: Risk for activity intolerance will decrease Outcome: Progressing   Problem: Nutrition: Goal: Adequate nutrition will be maintained Outcome: Progressing   Problem: Coping: Goal: Level of anxiety will decrease Outcome: Progressing   Problem: Elimination: Goal: Will not experience complications related to bowel motility Outcome: Progressing Goal: Will not experience complications related to urinary retention Outcome: Progressing   Problem: Pain Managment: Goal: General experience of comfort will improve Outcome: Progressing   Problem: Safety: Goal: Ability to remain free from injury will improve Outcome: Progressing   Problem: Skin Integrity: Goal: Risk for impaired skin integrity will decrease Outcome: Progressing   

## 2023-01-31 NOTE — Progress Notes (Signed)
Progress Note   Patient: Bridget Howe ZOX:[id number] DOB: [birth year] DOA: 01/28/2023     2 DOS: the patient was seen and examined on 01/31/2023   Brief hospital course:  ANIELLE HEADRICK is a 77 y.o. 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 She also feels like her vision is blurred.  She has associated nausea.  She denies headache,  one-sided weakness numbness or tingling, slurred speech or difficulty swallowing.  Denies chest pain or shortness of breath or palpitations.  Denies fever or chills. States that 2 weeks ago when she developed the infection in her knee she also had a 3-day bout of diarrhea.  Since that time she has had decreased oral intake and states she has lost up to 10 pounds.  Her wound infection cleared up with the Bactrim and she has since followed up with orthopedics. ED course and data review: BP 184/85 with heart rate in the 50s and 60s and otherwise normal vitals Labs: Notable for normal CBC, CMP notable for creatinine of 1.57 which is up from baseline of 0.83 a month prior.  Calcium noted to be elevated at 14.4 up from 9.3 a month prior.  Also has elevated liver enzymes with AST 104, ALT 163 and alk phos 137. TSH normal COVID-negative UA unremarkable EKG, personally viewed and interpreted showing NSR at 63 with no acute ST-T wave changes. MRI brain unremarkable. Patient is admitted to the hospitalist service for further management evaluation of vertigo, dehydration, elevated LFTs, hypercalcemia.      Assessment and Plan:  * Vertigo In the setting of AKI and dehydration. Improved Continue gentle IV hydration. Encourage oral diet, supplements. PT evaluation, out of bed to  chair.     Hypercalcemia Unclear etiology PTH is low at 4 which rules out a parathyroid adenoma.  Patient is on vitamin D supplements which may increase calcium level. Vitamin D, 1, 25 dihydroxy level is elevated at 128 Calcium levels remain elevated Continue IV fluid hydration Will give a dose of Zolendronic acid Repeat calcium levels in am      AKI (acute kidney injury) (HCC) In the setting of poor oral intake, dehydration and bactrim use.   Baseline serum creatinine is 0.8 and on admission it was 1.5 Stop Celebrex. Avoid all nephrotoxic drugs. Continue IV hydration. Monitor daily renal function.     Unintentional weight loss Patient lost 10 pounds in the past couple weeks likely due to poor oral intake and GI illness a couple weeks prior however with hypercalcemia, might consider malignancy workup if over will symptoms not improving with hydration.     Elevated liver enzymes Possibly related to adverse effect of Bactrim, Tylenol for pain IV hydration and monitor Liver functions improving with IV hydration. Trend LFTs.   Adverse effect of sulfamethoxazole/trimethoprim Suspect causing both abnormal renal function and abnormal liver function Patient no longer on Bactrim, completed 3 days ago.   Prediabetes A1c 5.5. Consistent carbohydrate diet.   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 Recently followed up with Ortho -no further antibiotics or surgical intervention needed.   HTN (hypertension) Uncontrolled Add amlodipine 5 mg daily and uptitrate to optimize blood pressure control Continue  hydralazine 3 times daily   Coronary artery disease Continue aspirin. History of an MI at age 46   OSA on CPAP CPAP nightly   Hypothyroid Continue levothyroxine     Exertional chest pain Improves at rest Obtain twelve-lead EKG to rule out an acute coronary syndrome.  (Twelve-lead EKG reviewed and shows nonspecific T wave changes, no acute ST or T wave changes) Troponin negative         Subjective: Patient is seen and examined at the bedside.  Feels better has no new complaints  Physical Exam: Vitals:   01/31/23 0420 01/31/23 0800 01/31/23 1000 01/31/23 1159  BP: (!) 143/67  (!) 158/78 (!) 169/83  Pulse: 68  63 69  Resp: 18 16 14    Temp: 98.9 F (37.2 C)  98.2 F (36.8 C) 97.6 F (36.4 C)  TempSrc: Oral  Oral Oral  SpO2: 95%  95% 96%  Weight:      Height:       General - Elderly Caucasian female, no apparent distress HEENT - PERRLA, EOMI, atraumatic head, non tender sinuses. Lung - CTAB/L Heart - S1, S2 heard, no murmurs, rubs, trace pedal edema. Abdomen - Soft, non tender, non guarding, bowel sounds good Neuro - Alert, awake and oriented x 3, non focal exam. Skin - Warm and dry. Right knee surgical scar, mild redness   Data Reviewed: Labs reviewed.  Calcium 12.2, creatinine 1.27 There are no new results to review at this time.  Family Communication: Plan of care discussed with patient, husband and daughter over the phone.  All questions and concerns have been addressed.  Disposition: Status is: Inpatient Remains inpatient appropriate because: Treatment of hypercalcemia  Planned Discharge Destination: Home    Time spent: 35 minutes  Author: Lucile Shutters, MD 01/31/2023 2:45 PM  For on call review [email or web address].

## 2023-01-31 NOTE — Evaluation (Signed)
Physical Therapy Evaluation Patient Details Name: Bridget Howe MRN: [id number] DOB: [birth year] Today's Date: 01/31/2023  History of Present Illness  Pt is a 77 y.o. 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 She also feels like her vision is blurred.  She has associated nausea.  She denies headache,  one-sided weakness numbness or tingling, slurred speech or difficulty swallowing.  Denies chest pain or shortness of breath or palpitations.  Denies fever or chills.  Clinical Impression  Pt is received in bed with spouse at bedside, she is agreeable to PT session. At baseline, Pt reports being independent with all ADLs/IADLs and recently being discharge by OP PT for R TKA. Pt performs all mobility with close sup for safety. Pt able to amb approx 200 ft without AD with no reports of dizziness/lightheadedness or notable LOB. Pt performed standing balance exercise to assess balance/unsteadiness with slight swaying during SLS but able to sustain positioning. PT performed 5xSTS test to assess fall risk and Pt scored below cut off indicating no fall risk. Communicated with care team regarding no recommendation of f/u PT at this time. Will plan to discharge Pt from skilled PT.        If plan is discharge home, recommend the following:     Can travel by private vehicle        Equipment Recommendations None recommended by PT  Recommendations for Other Services       Functional Status Assessment Patient has not had a recent decline in their functional status     Precautions / Restrictions Precautions Precautions: None Restrictions Weight Bearing Restrictions: No      Mobility  Bed Mobility Overal bed mobility:  Independent             General bed mobility comments: Pt able to perform bed mobility with no cuing required    Transfers Overall transfer level: Independent Equipment used: None               General transfer comment: Pt able to perform STS with no use of hand railings or notable LOB    Ambulation/Gait   Gait Distance (Feet): 200 Feet Assistive device: None Gait Pattern/deviations: WFL(Within Functional Limits) Gait velocity: WNL     General Gait Details: pt able to amb approx 200 ft with no reports of dizziness or notable unsteadiness  Stairs            Wheelchair Mobility     Tilt Bed    Modified Rankin (Stroke Patients Only)       Balance Overall balance assessment: Independent                                           Pertinent Vitals/Pain Pain Assessment Pain Assessment: No/denies pain    Home Living Family/patient expects to be discharged to:: Private residence Living Arrangements: Spouse/significant other Available Help at Discharge: Family;Available 24 hours/day Type of Home: House Home Access: Level entry       Home Layout: One level Home Equipment: Agricultural consultant (2 wheels);Cane - single point;Grab bars - tub/shower Additional Comments: does not use AD for amb  Prior Function Prior Level of Function : Independent/Modified Independent;Driving             Mobility Comments: patient fully independent at baseline, takes care of grandchildren ADLs Comments: independent     Extremity/Trunk Assessment   Upper Extremity Assessment Upper Extremity Assessment: Overall WFL for tasks assessed    Lower Extremity Assessment Lower Extremity Assessment: Overall WFL for tasks assessed       Communication   Communication Communication: No apparent difficulties Cueing Techniques: Verbal cues  Cognition Arousal: Alert Behavior During Therapy: WFL for tasks assessed/performed Overall Cognitive Status:  Within Functional Limits for tasks assessed                                 General Comments: AO x4; pleasant and cooperative during PT session        General Comments General comments (skin integrity, edema, etc.): 5xSTS:11.20 sec modified with use of bed    Exercises Other Exercises Other Exercises: standing romberg x30 sec, R/L semi tandem x30 sec, R/L tandem x30 sec, R/L SLS x30 sec ea   Assessment/Plan    PT Assessment Patient does not need any further PT services  PT Problem List         PT Treatment Interventions Functional mobility training    PT Goals (Current goals can be found in the Care Plan section)  Acute Rehab PT Goals Patient Stated Goal: to go home PT Goal Formulation: With patient Time For Goal Achievement: 01/31/23 Potential to Achieve Goals: Good    Frequency Min 1X/week     Co-evaluation               AM-PAC PT "6 Clicks" Mobility  Outcome Measure Help needed turning from your back to your side while in a flat bed without using bedrails?: None Help needed moving from lying on your back to sitting on the side of a flat bed without using bedrails?: None Help needed moving to and from a bed to a chair (including a wheelchair)?: None Help needed standing up from a chair using your arms (e.g., wheelchair or bedside chair)?: None Help needed to walk in hospital room?: None Help needed climbing 3-5 steps with a railing? : A Little 6 Click Score: 23    End of Session Equipment Utilized During Treatment: Gait belt Activity Tolerance: Patient tolerated treatment well Patient left: in bed;with call bell/phone within reach Nurse Communication: Mobility status      Time: 4098-1191 PT Time Calculation (min) (ACUTE ONLY): 17 min   Charges:                 Bridget Howe, SPT   Bridget Howe 01/31/2023, 12:27 PM

## 2023-02-01 DIAGNOSIS — T368X5A Adverse effect of other systemic antibiotics, initial encounter: Secondary | ICD-10-CM | POA: Diagnosis not present

## 2023-02-01 DIAGNOSIS — N179 Acute kidney failure, unspecified: Secondary | ICD-10-CM | POA: Diagnosis not present

## 2023-02-01 DIAGNOSIS — R634 Abnormal weight loss: Secondary | ICD-10-CM

## 2023-02-01 DIAGNOSIS — R42 Dizziness and giddiness: Secondary | ICD-10-CM | POA: Diagnosis not present

## 2023-02-01 LAB — BASIC METABOLIC PANEL
Anion gap: 5 (ref 5–15)
BUN: 30 mg/dL — ABNORMAL HIGH (ref 8–23)
CO2: 27 mmol/L (ref 22–32)
Calcium: 12 mg/dL — ABNORMAL HIGH (ref 8.9–10.3)
Chloride: 106 mmol/L (ref 98–111)
Creatinine, Ser: 1.3 mg/dL — ABNORMAL HIGH (ref 0.44–1.00)
GFR, Estimated: 43 mL/min — ABNORMAL LOW (ref >60)
Glucose, Bld: 106 mg/dL — ABNORMAL HIGH (ref 70–99)
Potassium: 3.7 mmol/L (ref 3.5–5.1)
Sodium: 138 mmol/L (ref 135–145)

## 2023-02-01 LAB — GLUCOSE, CAPILLARY
Glucose-Capillary: 120 mg/dL — ABNORMAL HIGH (ref 70–99)
Glucose-Capillary: 86 mg/dL (ref 70–99)
Glucose-Capillary: 136 mg/dL — ABNORMAL HIGH (ref 70–99)
Glucose-Capillary: 100 mg/dL — ABNORMAL HIGH (ref 70–99)

## 2023-02-01 LAB — PHOSPHORUS: Phosphorus: 2.8 mg/dL (ref 2.5–4.6)

## 2023-02-01 LAB — VITAMIN D 25 HYDROXY (VIT D DEFICIENCY, FRACTURES): Vit D, 25-Hydroxy: 68.06 ng/mL (ref 30–100)

## 2023-02-01 LAB — PARATHYROID HORMONE, INTACT (NO CA): PTH: 7 pg/mL — ABNORMAL LOW (ref 15–65)

## 2023-02-01 MED ORDER — GLUCERNA SHAKE PO LIQD
237.0000 mL | Freq: Three times a day (TID) | ORAL | Status: DC
  Administered 2023-02-01 – 2023-02-03 (×3): 237 mL via ORAL

## 2023-02-01 MED ORDER — LACTATED RINGERS IV SOLN: INTRAVENOUS | Status: AC

## 2023-02-01 NOTE — Plan of Care (Signed)
  Problem: Education: Goal: Knowledge of the prescribed therapeutic regimen will improve Outcome: Progressing Goal: Individualized Educational Video(s) Outcome: Progressing   Problem: Activity: Goal: Ability to avoid complications of mobility impairment will improve Outcome: Progressing Goal: Range of joint motion will improve Outcome: Progressing   Problem: Clinical Measurements: Goal: Postoperative complications will be avoided or minimized Outcome: Progressing   Problem: Pain Management: Goal: Pain level will decrease with appropriate interventions Outcome: Progressing   Problem: Skin Integrity: Goal: Will show signs of wound healing Outcome: Progressing   Problem: Education: Goal: Ability to describe self-care measures that may prevent or decrease complications (Diabetes Survival Skills Education) will improve Outcome: Progressing Goal: Individualized Educational Video(s) Outcome: Progressing   Problem: Coping: Goal: Ability to adjust to condition or change in health will improve Outcome: Progressing   Problem: Fluid Volume: Goal: Ability to maintain a balanced intake and output will improve Outcome: Progressing   Problem: Health Behavior/Discharge Planning: Goal: Ability to identify and utilize available resources and services will improve Outcome: Progressing Goal: Ability to manage health-related needs will improve Outcome: Progressing   Problem: Metabolic: Goal: Ability to maintain appropriate glucose levels will improve Outcome: Progressing   Problem: Nutritional: Goal: Maintenance of adequate nutrition will improve Outcome: Progressing Goal: Progress toward achieving an optimal weight will improve Outcome: Progressing   Problem: Skin Integrity: Goal: Risk for impaired skin integrity will decrease Outcome: Progressing   Problem: Tissue Perfusion: Goal: Adequacy of tissue perfusion will improve Outcome: Progressing   Problem: Education: Goal:  Knowledge of General Education information will improve Description: Including pain rating scale, medication(s)/side effects and non-pharmacologic comfort measures Outcome: Progressing   Problem: Health Behavior/Discharge Planning: Goal: Ability to manage health-related needs will improve Outcome: Progressing   Problem: Clinical Measurements: Goal: Ability to maintain clinical measurements within normal limits will improve Outcome: Progressing Goal: Will remain free from infection Outcome: Progressing Goal: Diagnostic test results will improve Outcome: Progressing Goal: Respiratory complications will improve Outcome: Progressing Goal: Cardiovascular complication will be avoided Outcome: Progressing   Problem: Activity: Goal: Risk for activity intolerance will decrease Outcome: Progressing   Problem: Nutrition: Goal: Adequate nutrition will be maintained Outcome: Progressing   Problem: Coping: Goal: Level of anxiety will decrease Outcome: Progressing   Problem: Elimination: Goal: Will not experience complications related to bowel motility Outcome: Progressing Goal: Will not experience complications related to urinary retention Outcome: Progressing   Problem: Pain Managment: Goal: General experience of comfort will improve Outcome: Progressing   Problem: Safety: Goal: Ability to remain free from injury will improve Outcome: Progressing   Problem: Skin Integrity: Goal: Risk for impaired skin integrity will decrease Outcome: Progressing   

## 2023-02-01 NOTE — Progress Notes (Signed)
Nutrition Follow-up  DOCUMENTATION CODES:   Not applicable  INTERVENTION:   -D/c Ensure Enlive po BID, each supplement provides 350 kcal and 20 grams of protein.  -Continue MVI with minerals daily -Continue regular diet -Glucerna Shake po TID, each supplement provides 220 kcal and 10 grams of protein   NUTRITION DIAGNOSIS:   Increased nutrient needs related to acute illness as evidenced by estimated needs.  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  Reviewed I/O's: +248 ml x 24 hours and +5.1 L since admission  Pt with improved oral intake. Noted meal completions 100%. Pt with variable acceptance of supplements.   Wt has been stable since admission.   Medications reviewed and include lactated ringers infusion @ 100 ml/hr.   Labs reviewed: CBGS: 86-128 (inpatient orders for glycemic control are 0-15 units insulin aspart TID with meals).     Diet Order:   Diet Order             Diet regular Room service appropriate? Yes; Fluid consistency: Thin  Diet effective now                   EDUCATION NEEDS:   Education needs have been addressed  Skin:  Skin Assessment: Reviewed RN Assessment  Last BM:  01/28/23  Height:   Ht Readings from Last 1 Encounters:  01/28/23 5\' 2"  (1.575 m)    Weight:   Wt Readings from Last 1 Encounters:  01/30/23 68.9 kg    Ideal Body Weight:  50 kg  BMI:  Body mass index is 27.78 kg/m.  Estimated Nutritional Needs:   Kcal:  1700-1900  Protein:  90-105 grams  Fluid:  > 1.7 L    Levada Schilling, RD, LDN,  CDCES Registered Dietitian III Certified Diabetes Care and Education Specialist Please refer to [facility] for RD and/or RD on-call/weekend/after hours pager

## 2023-02-01 NOTE — Hospital Course (Addendum)
Taken from prior notes.  Bridget Howe is a 77 y.o. 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 She also feels like her vision is blurred.  She has associated nausea.   States that 2 weeks ago when she developed the infection in her knee she also had a 3-day bout of diarrhea. Since that time she has had decreased oral intake and states she has lost up to 10 pounds. Her wound infection cleared up with the Bactrim and she has since followed up with orthopedics.   On presentation she was hemodynamically stable, labs pertinent for increase in creatinine to 1.57 and elevated calcium at 14.4, mildly elevated liver enzymes with AST 104, ALT 163 and alkaline phosphatase 137.  TSH was normal.  COVID-19 PCR negative, UA unremarkable.  MRI brain was without any acute abnormality.  Patient did receive a dose of bisphosphonate and IV fluid, hypercalcemia seems very slowly improving.  Hypercalcemia labs with low appropriate parathyroid, significantly elevated vitamin D level at 128-patient was taking regular vitamin D supplement, could not remember the strength. Normal phosphorous.  Case was also discussed with nephrology and decided to continue with IV fluid and close monitoring of calcium.  Currently asymptomatic.  Patient need to stop her home calcium and vitamin D supplement for now.  10/3: Persistent hypercalcemia, 25-hydroxy Calciferol levels were within upper normal limit, elevated from 1/25 hydroxy Calciferol, stable but persistently elevated mild creatinine and liver enzymes.  Ordered CT chest, abdomen and pelvis, also ordered multiple labs to rule out any underlying occult malignancy especially lymphoma or sarcoidosis.  Patient  was also drinking a lot of milk and eat cheese-advised to limit the use and holding vitamin D supplement. Also started on calcitonin subcu.  10/4: Corrected calcium 10.7 today, ACE levels came back elevated at 105, improving transaminitis and AKI resolved.  Urine calcium and creatinine ratio elevated at 661.  Rest of the labs pending.  Patient feels very tremulous after getting contrast with CT chest yesterday.  CT chest abdomen and pelvis was without any abnormality.  Discussed about getting an MRI brain with contrast to see any perineural enhancement, patient would like to wait due to the recent contrast and not feeling well afterward.  No eye symptoms but patient was advised to follow-up with his ophthalmologist as outpatient.  Patient will get benefit from outpatient rheumatology evaluation.  No pulmonary sarcoidosis but sarcoidosis involving bone or other parts of the body might be a possibility.  10/5: Corrected calcium 10.6 today.  Elevated kappa chain with increased kappa lambda ratio at 1.84, multiple myeloma panel still pending, discussed with Dr. Alena Bills from cancer center and they will see her as outpatient.  Patient still had pending multiple myeloma panel, parathyroid related hormone, SPEP and UPEP, labs.  Discussed with nephrology and there is no further doses of calcitonin required at this time.  Patient need to limit her calcium intake.  Stop taking vitamin D supplement.  Patient need further workup to rule out underlying sarcoidosis and any other occult malignancy especially lymphoma or multiple myeloma.  She was advised to follow-up with rheumatology and oncology for further workup.  She will likely need a repeat brain MRI with contrast and a whole-body bone scan.  She was  also advised to follow-up with her ophthalmologist to rule out any abnormality in the eye as we are looking for any occult sarcoidosis.  She was also started on antihypertensive amlodipine during current  hospitalization and her PCP can monitor and titrate.  Patient clinically feels much improved.  She wants to go home so she is being discharged home and she will complete her workup as outpatient.  She will continue with rest of her home medications and need to have a close follow-up with outpatient providers which should include primary care doctor, rheumatology and oncology for further workup.  She will also need a close monitoring of her calcium levels.

## 2023-02-01 NOTE — Care Management Important Message (Signed)
Important Message  Patient Details  Name: Bridget Howe MRN: [id number] Date of Birth: January 21, 1946   Important Message Given:  N/A - LOS <3 / Initial given by admissions     Johnell Comings 02/01/2023, 9:19 AM

## 2023-02-01 NOTE — Progress Notes (Signed)
Progress Note   Patient: Bridget Howe ZOX:[id number] DOB: 1945/11/06 DOA: 01/28/2023     3 DOS: the patient was seen and examined on 02/01/2023   Brief hospital course:  Bridget Howe is a 77 y.o. 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 She also feels like her vision is blurred.  She has associated nausea.  She denies headache,  one-sided weakness numbness or tingling, slurred speech or difficulty swallowing.  Denies chest pain or shortness of breath or palpitations.  Denies fever or chills. States that 2 weeks ago when she developed the infection in her knee she also had a 3-day bout of diarrhea.  Since that time she has had decreased oral intake and states she has lost up to 10 pounds.  Her wound infection cleared up with the Bactrim and she has since followed up with orthopedics. ED course and data review: BP 184/85 with heart rate in the 50s and 60s and otherwise normal vitals Labs: Notable for normal CBC, CMP notable for creatinine of 1.57 which is up from baseline of 0.83 a month prior.  Calcium noted to be elevated at 14.4 up from 9.3 a month prior.  Also has elevated liver enzymes with AST 104, ALT 163 and alk phos 137. TSH normal COVID-negative UA unremarkable EKG, personally viewed and interpreted showing NSR at 63 with no acute ST-T wave changes. MRI brain unremarkable. Patient is admitted to the hospitalist service for further management evaluation of vertigo, dehydration, elevated LFTs, hypercalcemia.   10/2: Vital stable, calcium of 12 and vitamin D level of 128. Giving some more IV fluid.  Should stop home supplement of calcium and vitamin D.   Assessment and Plan:  * Vertigo In the  setting of AKI and dehydration. Improved Continue gentle IV hydration. Encourage oral diet, supplements. PT evaluation, out of bed to chair.     Hypercalcemia Unclear etiology PTH is low at 4 which rules out a parathyroid adenoma.  Patient is on vitamin D supplements which may increase calcium level. Vitamin D, 1, 25 dihydroxy level is elevated at 128 Calcium levels remain elevated, although slowly improving, currently at 12, s/p 1 dose of zoledronic acid Continue IV fluid hydration Will give a dose of Zolendronic acid Repeat calcium levels in am      AKI (acute kidney injury) (HCC) In the setting of poor oral intake, dehydration and bactrim use.   Baseline serum creatinine is 0.8 and on admission it was 1.5 Stop Celebrex. Avoid all nephrotoxic drugs. Continue IV hydration. Monitor daily renal function.     Unintentional weight loss Patient lost 10 pounds in the past couple weeks likely due to poor oral intake and GI illness a couple weeks prior however with hypercalcemia, might consider malignancy workup if over will symptoms not improving with hydration.     Elevated liver enzymes Possibly related to adverse effect of Bactrim, Tylenol for pain IV hydration and monitor Liver functions improving with IV hydration. Trend LFTs.   Adverse effect of sulfamethoxazole/trimethoprim Suspect causing both abnormal renal function and abnormal liver function Patient no longer on Bactrim, completed 3 days ago.   Prediabetes A1c 5.5. Consistent carbohydrate diet.   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 Recently followed up with Ortho -no further antibiotics or surgical intervention needed.   HTN (hypertension) Blood pressure remained mildly elevated Continue hydralazine 3 times daily Increase the dose of amlodipine to 10 mg daily   Coronary artery disease Continue  aspirin. History of an MI at age 12   OSA on CPAP CPAP nightly   Hypothyroid Continue levothyroxine     Exertional chest pain Improves at rest Obtain twelve-lead EKG to rule out an acute coronary syndrome. (Twelve-lead EKG reviewed and shows nonspecific T wave changes, no acute ST or T wave changes) Troponin negative      Subjective: Patient was seen and examined today.  No more dizziness.  Appetite seems improving.  No nausea or vomiting.  Physical Exam: Vitals:   01/31/23 2309 02/01/23 0346 02/01/23 0802 02/01/23 1146  BP: (!) 156/71 (!) 169/82 (!) 161/83 (!) 153/72  Pulse: 72 77 70 75  Resp: 18 16 18 18   Temp: 98.4 F (36.9 C) 98.6 F (37 C) 98.5 F (36.9 C) 98.1 F (36.7 C)  TempSrc:  Oral    SpO2: 96% 95% 97% 96%  Weight:      Height:       General.  Frail elderly lady, in no acute distress. Pulmonary.  Lungs clear bilaterally, normal respiratory effort. CV.  Regular rate and rhythm, no JVD, rub or murmur. Abdomen.  Soft, nontender, nondistended, BS positive. CNS.  Alert and oriented .  No focal neurologic deficit. Extremities.  No edema, no cyanosis, pulses intact and symmetrical.  Right knee with bandage and ice pack Psychiatry.  Judgment and insight appears normal.    Data Reviewed: Prior data reviewed.  Family Communication: Plan of care discussed with patient, husband and daughter over the phone.  All questions and concerns have been addressed.  Disposition: Status is: Inpatient Remains inpatient appropriate because: Treatment of hypercalcemia  Planned Discharge Destination: Home  DVT prophylaxis.  Lovenox Time spent: 42 minutes  This record has been created using Conservation officer, historic buildings. Errors have been sought and corrected,but may not always be located. Such creation errors do not reflect on the standard of care.   Author: Arnetha Courser, MD 02/01/2023 1:59 PM  For on call review [email or web address].

## 2023-02-02 ENCOUNTER — Inpatient Hospital Stay: Payer: PPO

## 2023-02-02 DIAGNOSIS — R7303 Prediabetes: Secondary | ICD-10-CM

## 2023-02-02 DIAGNOSIS — R42 Dizziness and giddiness: Secondary | ICD-10-CM | POA: Diagnosis not present

## 2023-02-02 DIAGNOSIS — T368X5A Adverse effect of other systemic antibiotics, initial encounter: Secondary | ICD-10-CM | POA: Diagnosis not present

## 2023-02-02 DIAGNOSIS — N179 Acute kidney failure, unspecified: Secondary | ICD-10-CM | POA: Diagnosis not present

## 2023-02-02 LAB — CBC
HCT: 37.9 % (ref 36.0–46.0)
Hemoglobin: 12.8 g/dL (ref 12.0–15.0)
MCH: 31.8 pg (ref 26.0–34.0)
MCHC: 33.8 g/dL (ref 30.0–36.0)
MCV: 94.3 fL (ref 80.0–100.0)
Platelets: 365 10*3/uL (ref 150–400)
RBC: 4.02 MIL/uL (ref 3.87–5.11)
RDW: 12.5 % (ref 11.5–15.5)
WBC: 7.2 10*3/uL (ref 4.0–10.5)
nRBC: 0 % (ref 0.0–0.2)

## 2023-02-02 LAB — COMPREHENSIVE METABOLIC PANEL
ALT: 107 U/L — ABNORMAL HIGH (ref 0–44)
AST: 58 U/L — ABNORMAL HIGH (ref 15–41)
Albumin: 3.2 g/dL — ABNORMAL LOW (ref 3.5–5.0)
Alkaline Phosphatase: 91 U/L (ref 38–126)
Anion gap: 8 (ref 5–15)
BUN: 33 mg/dL — ABNORMAL HIGH (ref 8–23)
CO2: 28 mmol/L (ref 22–32)
Calcium: 12.1 mg/dL — ABNORMAL HIGH (ref 8.9–10.3)
Chloride: 104 mmol/L (ref 98–111)
Creatinine, Ser: 1.29 mg/dL — ABNORMAL HIGH (ref 0.44–1.00)
GFR, Estimated: 43 mL/min — ABNORMAL LOW (ref >60)
Glucose, Bld: 112 mg/dL — ABNORMAL HIGH (ref 70–99)
Potassium: 4.1 mmol/L (ref 3.5–5.1)
Sodium: 140 mmol/L (ref 135–145)
Total Bilirubin: 0.2 mg/dL — ABNORMAL LOW (ref 0.3–1.2)
Total Protein: 6.9 g/dL (ref 6.5–8.1)

## 2023-02-02 LAB — MAGNESIUM: Magnesium: 2 mg/dL (ref 1.7–2.4)

## 2023-02-02 LAB — GLUCOSE, CAPILLARY: Glucose-Capillary: 88 mg/dL (ref 70–99)

## 2023-02-02 MED ORDER — IOHEXOL 9 MG/ML PO SOLN
500.0000 mL | ORAL | Status: AC
  Administered 2023-02-02 (×2): 500 mL via ORAL

## 2023-02-02 MED ORDER — IOHEXOL 300 MG/ML  SOLN
100.0000 mL | Freq: Once | INTRAMUSCULAR | Status: AC | PRN
  Administered 2023-02-02: 100 mL via INTRAVENOUS

## 2023-02-02 MED ORDER — DIPHENHYDRAMINE HCL 50 MG/ML IJ SOLN
25.0000 mg | Freq: Once | INTRAMUSCULAR | Status: AC
  Administered 2023-02-02: 25 mg via INTRAVENOUS
  Filled 2023-02-02: qty 1

## 2023-02-02 MED ORDER — LACTATED RINGERS IV SOLN: INTRAVENOUS | Status: AC

## 2023-02-02 MED ORDER — CALCITONIN (SALMON) 200 UNIT/ML IJ SOLN
400.0000 [IU] | Freq: Two times a day (BID) | INTRAMUSCULAR | Status: AC
  Administered 2023-02-02 – 2023-02-03 (×3): 400 [IU] via SUBCUTANEOUS
  Filled 2023-02-02 (×3): qty 2

## 2023-02-02 NOTE — Progress Notes (Signed)
Progress Note   Patient: Bridget Howe:[id number] DOB: 02/20/1946 DOA: 01/28/2023     4 DOS: the patient was seen and examined on 02/02/2023   Brief hospital course:  Bridget Howe is a 77 y.o. 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 She also feels like her vision is blurred.  She has associated nausea.   States that 2 weeks ago when she developed the infection in her knee she also had a 3-day bout of diarrhea. Since that time she has had decreased oral intake and states she has lost up to 10 pounds. Her wound infection cleared up with the Bactrim and she has since followed up with orthopedics.   On presentation she was hemodynamically stable, labs pertinent for increase in creatinine to 1.57 and elevated calcium at 14.4, mildly elevated liver enzymes with AST 104, ALT 163 and alkaline phosphatase 137.  TSH was normal.  COVID-19 PCR negative, UA unremarkable.  MRI brain was without any acute abnormality.  Patient did receive a dose of bisphosphonate and IV fluid, hypercalcemia seems very slowly improving.  Hypercalcemia labs with low appropriate parathyroid, significantly elevated vitamin D level at 128-patient was taking regular vitamin D supplement, could not remember the strength. Normal phosphorous.  Case was also discussed with nephrology and decided to continue with IV fluid and close monitoring of calcium.  Currently asymptomatic.  Patient need to stop her home calcium and vitamin D supplement for now.  10/3: Persistent hypercalcemia, 25-hydroxy Calciferol levels were within upper normal limit, elevated from 1/25 hydroxy Calciferol, stable but persistently elevated mild creatinine and liver  enzymes.  Ordered CT chest, abdomen and pelvis, also ordered multiple labs to rule out any underlying occult malignancy especially lymphoma or sarcoidosis.  Patient was also drinking a lot of milk and eat cheese-advised to limit the use and holding vitamin D supplement. Also started on calcitonin subcu.  Assessment and Plan:  * Vertigo In the setting of AKI and dehydration. Improved Continue gentle IV hydration. Encourage oral diet, supplements. PT evaluation, out of bed to chair.     Hypercalcemia Unclear etiology PTH is low at 4 which rules out a parathyroid adenoma.  Patient is on vitamin D supplements which may increase calcium level. Vitamin D, 1, 25 dihydroxy level is elevated at 128 but 25-hydroxy levels were within upper normal limit. Calcium levels remain elevated, although slowly improving, currently at 12.1, s/p 1 dose of zoledronic acid Continue IV fluid hydration Started workup for any occult malignancy especially sarcoidosis or lymphoma. Started on calcitonin for 4 doses -Monitor calcium  AKI (acute kidney injury) (HCC) In the setting of poor oral intake, dehydration and bactrim use.   Baseline serum creatinine is 0.8 and on admission it was 1.5 Stop Celebrex. Avoid all nephrotoxic drugs. Continue IV hydration. Monitor daily renal function-patient will get IV contrast today at 4 CT chest, abdomen and pelvis-low contrast protocol ordered     Unintentional weight loss Patient lost 10 pounds in the past couple weeks likely due to poor oral intake and GI illness a couple weeks prior however with hypercalcemia, started malignancy workup.     Elevated liver enzymes Possibly related to adverse effect of Bactrim, Tylenol for pain IV hydration and monitor Liver  functions stable with a small improvement but remained elevated. Trend LFTs.   Adverse effect of sulfamethoxazole/trimethoprim Suspect causing both abnormal renal function and abnormal liver function Patient  no longer on Bactrim, completed 3 days ago.   Prediabetes A1c 5.5. Consistent carbohydrate diet.   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 Recently followed up with Ortho -no further antibiotics or surgical intervention needed.   HTN (hypertension) Blood pressure remained mildly elevated Continue hydralazine 3 times daily Increase the dose of amlodipine to 10 mg daily   Coronary artery disease Continue aspirin. History of an MI at age 66   OSA on CPAP CPAP nightly   Hypothyroid Continue levothyroxine     Exertional chest pain Improves at rest Obtain twelve-lead EKG to rule out an acute coronary syndrome. (Twelve-lead EKG reviewed and shows nonspecific T wave changes, no acute ST or T wave changes) Troponin negative      Subjective: Patient was seen and examined with nephrology today.  No new concern.  Physical Exam: Vitals:   02/02/23 0444 02/02/23 0812 02/02/23 1132 02/02/23 1625  BP: (!) 175/87 (!) 165/81 (!) 172/63 (!) 157/59  Pulse: 71 68 64 77  Resp: 18 16 16 16   Temp: 99.1 F (37.3 C) 98.2 F (36.8 C) 97.9 F (36.6 C) 98.4 F (36.9 C)  TempSrc: Oral Oral  Oral  SpO2: 97% 96% 99% 96%  Weight:      Height:       General.  Well-developed elderly lady, in no acute distress. Pulmonary.  Lungs clear bilaterally, normal respiratory effort. CV.  Regular rate and rhythm, no JVD, rub or murmur. Abdomen.  Soft, nontender, nondistended, BS positive. CNS.  Alert and oriented .  No focal neurologic deficit. Extremities.  No edema, no cyanosis, pulses intact and symmetrical. Psychiatry.  Judgment and insight appears normal.    Data Reviewed: Prior data reviewed.  Family Communication: Discussed with husband at bedside.  Disposition: Status is: Inpatient Remains inpatient appropriate because: Treatment of hypercalcemia  Planned Discharge Destination: Home  DVT  prophylaxis.  Lovenox Time spent: 50 minutes  This record has been created using Conservation officer, historic buildings. Errors have been sought and corrected,but may not always be located. Such creation errors do not reflect on the standard of care.   Author: Arnetha Courser, MD 02/02/2023 4:38 PM  For on call review [email or web address].

## 2023-02-02 NOTE — TOC Progression Note (Signed)
Transition of Care [facility]) - Progression Note    Patient Details  Name: Bridget Howe MRN: [id number] Date of Birth: [birth year]  [facility] CM/SW Contact  Truddie Hidden, RN Phone Number: 02/02/2023, 10:13 AM  Clinical Narrative:    TOC continuing to follow patient's progress throughout discharge planning.        Expected Discharge Plan and Services                                               Social Determinants of Health (SDOH) Interventions SDOH Screenings   Food Insecurity: No Food Insecurity (01/29/2023)  Housing: Patient Declined (01/29/2023)  Transportation Needs: No Transportation Needs (01/29/2023)  Utilities: Not At Risk (01/29/2023)  Alcohol Screen: Low Risk  (05/31/2017)  Tobacco Use: Medium Risk (01/28/2023)    Readmission Risk Interventions     No data to display

## 2023-02-02 NOTE — Plan of Care (Signed)
  Problem: Education: Goal: Knowledge of the prescribed therapeutic regimen will improve Outcome: Progressing   Problem: Activity: Goal: Ability to avoid complications of mobility impairment will improve Outcome: Progressing Goal: Range of joint motion will improve Outcome: Progressing   Problem: Clinical Measurements: Goal: Postoperative complications will be avoided or minimized Outcome: Progressing   Problem: Pain Management: Goal: Pain level will decrease with appropriate interventions Outcome: Progressing   Problem: Skin Integrity: Goal: Will show signs of wound healing Outcome: Progressing   Problem: Coping: Goal: Ability to adjust to condition or change in health will improve Outcome: Progressing   Problem: Fluid Volume: Goal: Ability to maintain a balanced intake and output will improve Outcome: Progressing   Problem: Health Behavior/Discharge Planning: Goal: Ability to manage health-related needs will improve Outcome: Progressing   Problem: Metabolic: Goal: Ability to maintain appropriate glucose levels will improve Outcome: Progressing   Problem: Nutritional: Goal: Maintenance of adequate nutrition will improve Outcome: Progressing Goal: Progress toward achieving an optimal weight will improve Outcome: Progressing   Problem: Skin Integrity: Goal: Risk for impaired skin integrity will decrease Outcome: Progressing

## 2023-02-02 NOTE — Consult Note (Signed)
[facility] CONSULT NOTE    Date: 02/02/2023                  Patient Name:  Bridget Howe  MRN: [id number]  DOB: 1946/02/01  Age / Sex: 77 y.o., female         PCP: Lauro Regulus, MD                 Service Requesting Consult: Hospitalist                 Reason for Consult: Hypercalcemia, acute kidney injury            History of Present Illness: Patient is a 77 y.o. 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  As above the patient had right knee replacement on 12/14/2022.  She subsequently developed an infection and was treated with Keflex followed by Bactrim.  Prior to admission she was having dizziness and imbalance.  She was also having some visual disturbance.  Upon initial presentation she had severe hypercalcemia with serum calcium of 14.4.  BUN was also elevated at that time at 33 and creatinine was 1.58.  On 12/07/2022 BUN was 14 with a creatinine of 0.8 with normal calcium at 9.3.  With conservative measures serum calcium has come down to 12.1 but is still a bit high.  She has taken intermittent antacids but not on a daily basis.  She is also on vitamin D with vitamin D 25 level of 68 and vitamin D 1, 25 elevated at 128.  Case discussed with hospitalist.  CT scan chest, abdomen, and pelvis has been ordered.   Medications: Outpatient medications: Medications Prior to Admission  Medication Sig Dispense Refill Last Dose   acetaminophen (TYLENOL) 500 MG tablet Take 500 mg by mouth every 4 (four) hours as needed for mild pain.   Unknown at PRN   aspirin EC 81 MG tablet Take 1 tablet (81 mg total) by mouth in the morning and at bedtime.   01/28/2023  at 0900   b complex vitamins capsule Take 1 capsule by mouth daily.      celecoxib (CELEBREX) 200 MG capsule Take 1 capsule (200 mg total) by mouth 2 (two) times daily. 60 capsule 1 01/28/2023 at 0900   cholecalciferol (VITAMIN D3) 25 MCG (1000 UNIT) tablet Take 1,000 Units by mouth daily.      Fiber Adult Gummies 2 g CHEW Chew 2 tablets by mouth at bedtime.      fluticasone (FLONASE) 50 MCG/ACT nasal spray Place 2 sprays into both nostrils daily.      Krill Oil 500 MG CAPS Take 1 capsule by mouth daily.      levothyroxine (SYNTHROID, LEVOTHROID) 50 MCG tablet Take 50 mcg by mouth daily before breakfast.   01/28/2023 at 0800   Misc Natural Products (OSTEO BI-FLEX ADV DOUBLE ST PO) Take 1 tablet by mouth 2 (two) times daily.      HYDROcodone-acetaminophen (NORCO) 5-325 MG tablet Take 1 tablet by mouth every 4 (four) hours as needed for moderate pain. (Patient not taking: Reported on 01/28/2023) 30 tablet 0 Not Taking   HYDROcodone-acetaminophen (NORCO/VICODIN) 5-325 MG tablet Take 1 tablet by mouth every 4 (four) hours as needed for  moderate pain. (Patient not taking: Reported on 01/28/2023) 30 tablet 0 Not Taking   traMADol (ULTRAM) 50 MG tablet Take 1-2 tablets (50-100 mg total) by mouth every 4 (four) hours as needed for moderate pain. (Patient not taking: Reported on 01/28/2023) 30 tablet 0 Not Taking    Current medications: Current Facility-Administered Medications  Medication Dose Route Frequency Provider Last Rate Last Admin   acetaminophen (TYLENOL) tablet 650 mg  650 mg Oral Q6H PRN Andris Baumann, MD       Or   acetaminophen (TYLENOL) suppository 650 mg  650 mg Rectal Q6H PRN Andris Baumann, MD       amLODipine (NORVASC) tablet 10 mg  10 mg Oral Daily Lindajo Royal V, MD   10 mg at 02/01/23 0916   calcitonin (MIACALCIN) injection 400 Units  400 Units Subcutaneous BID Arnetha Courser, MD       enoxaparin (LOVENOX) injection 40 mg  40 mg Subcutaneous Q24H Agbata, Tochukwu, MD   40 mg at  02/01/23 2115   feeding supplement (GLUCERNA SHAKE) (GLUCERNA SHAKE) liquid 237 mL  237 mL Oral TID BM Arnetha Courser, MD   237 mL at 02/01/23 2115   insulin aspart (novoLOG) injection 0-15 Units  0-15 Units Subcutaneous TID WC Andris Baumann, MD       levothyroxine (SYNTHROID) tablet 50 mcg  50 mcg Oral Q0600 Andris Baumann, MD   50 mcg at 02/02/23 0644   multivitamin with minerals tablet 1 tablet  1 tablet Oral Daily Agbata, Tochukwu, MD   1 tablet at 02/01/23 0916   ondansetron (ZOFRAN) tablet 4 mg  4 mg Oral Q6H PRN Andris Baumann, MD       Or   ondansetron [facility]) injection 4 mg  4 mg Intravenous Q6H PRN Andris Baumann, MD       traMADol Janean Sark) tablet 50-100 mg  50-100 mg Oral Q4H PRN Andris Baumann, MD          Allergies: Allergies  Allergen Reactions   Hydralazine Hcl Other (See Comments)    Headache, nausea, blurred vision   Influenza Vaccines Anaphylaxis   Caffeine    Copper-Containing Compounds    Covid-19 (Subunit) Vaccine Other (See Comments)    Chest pain   Egg-Derived Products Swelling   Gabapentin Other (See Comments)   Nexium [Esomeprazole] Cough    Joint pain   Nickel    Propofol Other (See Comments)    Patient prefers to avoid due to egg allergy   Pseudoephedrine Hcl    Ranitidine Hcl Cough    Joint pain   Tacrolimus       Past Medical History: Past Medical History:  Diagnosis Date   Actinic keratosis    Arthritis    Asthma    Complication of anesthesia    a.) egg white/yolk allergy - unable to tolerate propofol   Complication of anesthesia    limited ROM cervical   Coronary artery disease    COVID-19    Dysrhythmia    Hemorrhoids    Hypoglycemia    Hypothyroidism    Myocardial infarction (HCC)    anterior wall MI at 77 yr old   OSA treated with BiPAP    Prediabetes 10/08/2014   Primary osteoarthritis of right knee    Squamous cell carcinoma of skin 09/05/2007   Right dorsal hand   Squamous cell carcinoma of skin    L medial canthus,  treated around 1992   Stroke (HCC)  scan shows possible mini strokes     Past Surgical History: Past Surgical History:  Procedure Laterality Date   BREAST CYST ASPIRATION Bilateral    3 on right, 2 on the left   CARDIAC CATHETERIZATION     CARPAL TUNNEL RELEASE Bilateral    CATARACT EXTRACTION W/PHACO Left 05/10/2022   Procedure: CATARACT EXTRACTION PHACO AND INTRAOCULAR LENS PLACEMENT (IOC) LEFT  CLAREON VIVITY LENS  7.00  00:38.9;  Surgeon: Galen Manila, MD;  Location: [facility];  Service: Ophthalmology;  Laterality: Left;   CATARACT EXTRACTION W/PHACO Right 05/24/2022   Procedure: CATARACT EXTRACTION PHACO AND INTRAOCULAR LENS PLACEMENT (IOC) RIGHT CLAREON VIVITY 6.22 00:40.2;  Surgeon: Galen Manila, MD;  Location: [facility];  Service: Ophthalmology;  Laterality: Right;   COLONOSCOPY WITH PROPOFOL N/A 03/09/2016   Procedure: COLONOSCOPY WITH PROPOFOL;  Surgeon: Scot Jun, MD;  Location: [facility] ENDOSCOPY;  Service: Endoscopy;  Laterality: N/A;   ESOPHAGOGASTRODUODENOSCOPY (EGD) WITH PROPOFOL N/A 03/09/2016   Procedure: ESOPHAGOGASTRODUODENOSCOPY (EGD) WITH PROPOFOL;  Surgeon: Scot Jun, MD;  Location: [facility] ENDOSCOPY;  Service: Endoscopy;  Laterality: N/A;   KNEE ARTHROPLASTY Right 12/14/2022   Procedure: COMPUTER ASSISTED TOTAL KNEE ARTHROPLASTY;  Surgeon: Donato Heinz, MD;  Location: [facility] ORS;  Service: Orthopedics;  Laterality: Right;   KNEE ARTHROSCOPY Right    MENISCUS REPAIR Right    NECK SURGERY     TENDON REPAIR Left    wrist   TONSILLECTOMY     TRIGGER FINGER RELEASE Right      Family History: Family History  Problem Relation Age of Onset   Breast cancer Sister 57   Skin cancer Sister    Diabetes Mellitus II Sister    Hypertension Sister    Melanoma Sister    Hypertension Mother    Heart failure Mother    Skin cancer Father    Melanoma Father    Autoimmune disease Daughter      Social History: Social History    Socioeconomic History   Marital status: Married    Spouse name: Reuel Boom   Number of children: Not on file   Years of education: Not on file   Highest education level: Not on file  Occupational History   Not on file  Tobacco Use   Smoking status: Former   Smokeless tobacco: Never  Substance and Sexual Activity   Alcohol use: No   Drug use: No   Sexual activity: Not Currently    Birth control/protection: Post-menopausal  Other Topics Concern   Not on file  Social History Narrative   Not on file   Social Determinants of Health   Financial Resource Strain: Not on file  Food Insecurity: No Food Insecurity (01/29/2023)   Hunger Vital Sign    Worried About Running Out of Food in the Last Year: Never true    Ran Out of Food in the Last Year: Never true  Transportation Needs: No Transportation Needs (01/29/2023)   PRAPARE - Administrator, Civil Service (Medical): No    Lack of Transportation (Non-Medical): No  Physical Activity: Not on file  Stress: Not on file  Social Connections: Not on file  Intimate Partner Violence: Not At Risk (01/29/2023)   Humiliation, Afraid, Rape, and Kick questionnaire    Fear of Current or Ex-Partner: No    Emotionally Abused: No    Physically Abused: No    Sexually Abused: No     Review of Systems: Review of Systems  Constitutional:  Positive  for malaise/fatigue. Negative for chills and fever.  HENT:  Negative for congestion, hearing loss and tinnitus.   Eyes:  Positive for blurred vision. Negative for double vision.  Respiratory:  Negative for cough, sputum production and shortness of breath.   Cardiovascular:  Negative for chest pain, palpitations and orthopnea.  Gastrointestinal:  Negative for diarrhea, nausea and vomiting.  Genitourinary:  Negative for dysuria, frequency and urgency.  Musculoskeletal:  Negative for myalgias.  Skin:  Negative for itching and rash.  Neurological:  Positive for dizziness. Negative for focal  weakness.  Endo/Heme/Allergies:  Negative for polydipsia. Does not bruise/bleed easily.  Psychiatric/Behavioral:  The patient is not nervous/anxious.      Vital Signs: Blood pressure (!) 172/63, pulse 64, temperature 97.9 F (36.6 C), resp. rate 16, height 5\' 2"  (1.575 m), weight 68.9 kg, SpO2 99%.  Weight trends: Filed Weights   01/28/23 1605 01/29/23 1153 01/30/23 0413  Weight: 66.2 kg 68.3 kg 68.9 kg     Physical Exam: General: No acute distress  Head: Normocephalic, atraumatic. Moist oral mucosal membranes  Eyes: Anicteric  Neck: Supple  Lungs:  Clear to auscultation, normal effort  Heart: S1S2 no rubs  Abdomen:  Soft, nontender, bowel sounds present  Extremities: No peripheral edema.  Neurologic: Awake, alert, following commands  Skin: No acute rash  Access: No hemodialysis access    Lab results: Basic Metabolic Panel: Recent Labs  Lab 01/31/23 0942 02/01/23 0558 02/02/23 0632  NA 141 138 140  K 3.7 3.7 4.1  CL 106 106 104  CO2 28 27 28   GLUCOSE 104* 106* 112*  BUN 25* 30* 33*  CREATININE 1.27* 1.30* 1.29*  CALCIUM 12.2* 12.0* 12.1*  MG  --   --  2.0  PHOS  --  2.8  --     Liver Function Tests: Recent Labs  Lab 01/29/23 0411 01/30/23 0459 02/02/23 0632  AST 74* 73* 58*  ALT 118* 123* 107*  ALKPHOS 103 117 91  BILITOT 0.4 0.4 0.2*  PROT 5.9* 6.6 6.9  ALBUMIN 2.7* 3.1* 3.2*   No results for input(s): "LIPASE", "AMYLASE" in the last 168 hours. No results for input(s): "AMMONIA" in the last 168 hours.  CBC: Recent Labs  Lab 01/28/23 1609 01/29/23 0411 02/02/23 0632  WBC 7.8 5.6 7.2  HGB 13.6 11.1* 12.8  HCT 40.3 33.0* 37.9  MCV 94.8 95.9 94.3  PLT 372 295 365    Cardiac Enzymes: No results for input(s): "CKTOTAL", "CKMB", "CKMBINDEX", "TROPONINI" in the last 168 hours.  BNP: Invalid input(s): "POCBNP"  CBG: Recent Labs  Lab 02/01/23 0804 02/01/23 1145 02/01/23 1616 02/01/23 2035 02/02/23 0819  GLUCAP 120* 86 136* 100* 88     Microbiology: Results for orders placed or performed during the hospital encounter of 01/28/23  SARS Coronavirus 2 by RT PCR (hospital order, performed in [facility] hospital lab) *cepheid single result test* Anterior Nasal Swab     Status: None   Collection Time: 01/28/23  5:17 PM   Specimen: Anterior Nasal Swab  Result Value Ref Range Status   SARS Coronavirus 2 by RT PCR NEGATIVE NEGATIVE Final    Comment: (NOTE) SARS-CoV-2 target nucleic acids are NOT DETECTED.  The SARS-CoV-2 RNA is generally detectable in upper and lower respiratory specimens during the acute phase of infection. The lowest concentration of SARS-CoV-2 viral copies this assay can detect is 250 copies / mL. A negative result does not preclude SARS-CoV-2 infection and should not be used as the sole basis for  treatment or other patient management decisions.  A negative result may occur with improper specimen collection / handling, submission of specimen other than nasopharyngeal swab, presence of viral mutation(s) within the areas targeted by this assay, and inadequate number of viral copies (<250 copies / mL). A negative result must be combined with clinical observations, patient history, and epidemiological information.  Fact Sheet for Patients:   RoadLapTop.co.za  Fact Sheet for Healthcare Providers: http://kim-miller.com/  This test is not yet approved or  cleared by the [place] FDA and has been authorized for detection and/or diagnosis of SARS-CoV-2 by FDA under an Emergency Use Authorization (EUA).  This EUA will remain in effect (meaning this test can be used) for the duration of the COVID-19 declaration under Section 564(b)(1) of the Act, 21 U.S.C. section 360bbb-3(b)(1), unless the authorization is terminated or revoked sooner.  Performed at [facility], [address] [place], [place] [address]     Coagulation Studies: No results  for input(s): "LABPROT", "INR" in the last 72 hours.  Urinalysis: No results for input(s): "COLORURINE", "LABSPEC", "PHURINE", "GLUCOSEU", "HGBUR", "BILIRUBINUR", "KETONESUR", "PROTEINUR", "UROBILINOGEN", "NITRITE", "LEUKOCYTESUR" in the last 72 hours.  Invalid input(s): "APPERANCEUR"    Imaging: No results found.   Assessment & Plan: Pt is a 77 y.o. 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  Patient found to have significant hypercalcemia at admission at 14.4.  1.  Hypercalcemia.  Serum calcium 14.4 upon admission.  Now down to 12.1 with conservative measures.  Differential diagnosis extensive and can include undiagnosed sarcoidosis versus occult malignancy.  She has had some p.o. calcium intake but this does not appear to be excessive enough to lead to a calcium of 14.4.  We have recommended initiation of calcitonin and continuing IV fluid hydration.  May need to consider bisphosphonates but would hold off given mild acute kidney injury for now.  Vitamin D 125 was elevated at 128.  Further plan to be determined once CT scan of the chest abdomen and pelvis is available.  Multiple myeloma panel as well as serum ACE level have been ordered.  2.  Acute kidney injury.  Baseline creatinine 0.8.  Today creatinine is 1.29.  Continue IV fluid hydration.  Suspect that renal function will continue to improve once calcium continues to come down further.  3.  Thanks for consultation.

## 2023-02-02 NOTE — Progress Notes (Signed)
Message sent to Dr. Nelson Chimes regarding patient's trembling and shivering after IV contrast.  Once order received for IV benadryl.   02/02/23 1816  Vitals  Temp 98.1 F (36.7 C)  Temp Source Oral  BP (!) 156/74  MAP (mmHg) 96  BP Location Left Arm  BP Method Automatic  Patient Position (if appropriate) Lying  Pulse Rate 80  Pulse Rate Source Dinamap  Resp 17  Level of Consciousness  Level of Consciousness Alert  MEWS COLOR  MEWS Score Color Green  Oxygen Therapy  SpO2 97 %  O2 Device Room Air  Pain Assessment  Pain Scale 0-10  Pain Score 0  MEWS Score  MEWS Temp 0  MEWS Systolic 0  MEWS Pulse 0  MEWS RR 0  MEWS LOC 0  MEWS Score 0  Provider Notification  Provider Name/Title Dr. Amin/ Attending  Date Provider Notified 02/02/23  Time Provider Notified 1820  Method of Notification  (Secure Chat)  Notification Reason Change in status (Trembling after IV contrast)  Provider response Evaluate remotely;See new orders  Date of Provider Response 02/02/23  Time of Provider Response 1830

## 2023-02-03 DIAGNOSIS — T368X5A Adverse effect of other systemic antibiotics, initial encounter: Secondary | ICD-10-CM | POA: Diagnosis not present

## 2023-02-03 DIAGNOSIS — R42 Dizziness and giddiness: Secondary | ICD-10-CM | POA: Diagnosis not present

## 2023-02-03 DIAGNOSIS — N179 Acute kidney failure, unspecified: Secondary | ICD-10-CM | POA: Diagnosis not present

## 2023-02-03 LAB — COMPREHENSIVE METABOLIC PANEL
ALT: 77 U/L — ABNORMAL HIGH (ref 0–44)
AST: 41 U/L (ref 15–41)
Albumin: 3.3 g/dL — ABNORMAL LOW (ref 3.5–5.0)
Alkaline Phosphatase: 88 U/L (ref 38–126)
Anion gap: 11 (ref 5–15)
BUN: 19 mg/dL (ref 8–23)
CO2: 26 mmol/L (ref 22–32)
Calcium: 10.1 mg/dL (ref 8.9–10.3)
Chloride: 101 mmol/L (ref 98–111)
Creatinine, Ser: 0.96 mg/dL (ref 0.44–1.00)
GFR, Estimated: >60 mL/min (ref >60)
Glucose, Bld: 101 mg/dL — ABNORMAL HIGH (ref 70–99)
Potassium: 3.7 mmol/L (ref 3.5–5.1)
Sodium: 138 mmol/L (ref 135–145)
Total Bilirubin: 0.7 mg/dL (ref 0.3–1.2)
Total Protein: 6.7 g/dL (ref 6.5–8.1)

## 2023-02-03 LAB — CALCIUM / CREATININE RATIO, URINE
Calcium, Ur: 19.1 mg/dL
Calcium/Creat.Ratio: 661 mg/g{creat} — ABNORMAL HIGH (ref 29–442)
Creatinine, Urine: 28.9 mg/dL

## 2023-02-03 LAB — KAPPA/LAMBDA LIGHT CHAINS
Kappa free light chain: 47 mg/L — ABNORMAL HIGH (ref 3.3–19.4)
Kappa, lambda light chain ratio: 1.84 — ABNORMAL HIGH (ref 0.26–1.65)
Lambda free light chains: 25.6 mg/L (ref 5.7–26.3)

## 2023-02-03 LAB — ANGIOTENSIN CONVERTING ENZYME: Angiotensin-Converting Enzyme: 105 U/L — ABNORMAL HIGH (ref 14–82)

## 2023-02-03 MED ORDER — SODIUM CHLORIDE 0.9 % IV SOLN: INTRAVENOUS | Status: DC

## 2023-02-03 NOTE — Progress Notes (Signed)
[facility]  ROUNDING NOTE   Subjective:   Patient seen and evaluated at bedside. CT scan chest, abdomen, and pelvis was negative for acute findings. Calcium appears to have normalized now at 10.1. Patient states that she had some nausea and vomiting postcontrast administration. Creatinine has normalized at 0.96. Patient does appear to have an elevated angiotensin-converting enzyme level.  Objective:  Vital signs in last 24 hours:  Temp:  [97.8 F (36.6 C)-98.2 F (36.8 C)] 98.2 F (36.8 C) (10/04 2001) Pulse Rate:  [59-65] 65 (10/04 2001) Resp:  [17-20] 20 (10/04 2001) BP: (121-152)/(56-75) 149/65 (10/04 2001) SpO2:  [94 %-97 %] 94 % (10/04 2001) FiO2 (%):  [21 %] 21 % (10/03 2114)  Weight change:  Filed Weights   01/28/23 1605 01/29/23 1153 01/30/23 0413  Weight: 66.2 kg 68.3 kg 68.9 kg    Intake/Output: I/O last 3 completed shifts: In: 2853.8 [P.O.:440; I.V.:2413.8] Out: 200 [Urine:200]   Intake/Output this shift:  No intake/output data recorded.  Physical Exam: General: No acute distress  Head: Normocephalic, atraumatic. Moist oral mucosal membranes  Neck: Supple  Lungs:  Nonlabored  Heart: S1S2   Abdomen:  Soft, nontender, bowel sounds present  Extremities: No peripheral edema.  Neurologic: Awake, alert, following commands  Skin: No acute rash  Access: No hemodialysis access    Basic Metabolic Panel: Recent Labs  Lab 01/30/23 0459 01/31/23 0942 02/01/23 0558 02/02/23 0632 02/03/23 0640  NA 141 141 138 140 138  K 4.3 3.7 3.7 4.1 3.7  CL 106 106 106 104 101  CO2 28 28 27 28 26   GLUCOSE 113* 104* 106* 112* 101*  BUN 30* 25* 30* 33* 19  CREATININE 1.32* 1.27* 1.30* 1.29* 0.96  CALCIUM 12.5* 12.2* 12.0* 12.1* 10.1  MG  --   --   --  2.0  --   PHOS  --   --  2.8  --   --     Liver Function Tests: Recent Labs  Lab 01/28/23 1636 01/29/23 0411 01/30/23 0459 02/02/23 0632 02/03/23 0640  AST 104* 74* 73* 58* 41  ALT 163* 118*  123* 107* 77*  ALKPHOS 137* 103 117 91 88  BILITOT 0.6 0.4 0.4 0.2* 0.7  PROT 7.8 5.9* 6.6 6.9 6.7  ALBUMIN 3.7 2.7* 3.1* 3.2* 3.3*   No results for input(s): "LIPASE", "AMYLASE" in the last 168 hours. No results for input(s): "AMMONIA" in the last 168 hours.  CBC: Recent Labs  Lab 01/28/23 1609 01/29/23 0411 02/02/23 0632  WBC 7.8 5.6 7.2  HGB 13.6 11.1* 12.8  HCT 40.3 33.0* 37.9  MCV 94.8 95.9 94.3  PLT 372 295 365    Cardiac Enzymes: No results for input(s): "CKTOTAL", "CKMB", "CKMBINDEX", "TROPONINI" in the last 168 hours.  BNP: Invalid input(s): "POCBNP"  CBG: Recent Labs  Lab 02/01/23 0804 02/01/23 1145 02/01/23 1616 02/01/23 2035 02/02/23 0819  GLUCAP 120* 86 136* 100* 88    Microbiology: Results for orders placed or performed during the hospital encounter of 01/28/23  SARS Coronavirus 2 by RT PCR (hospital order, performed in [facility] hospital lab) *cepheid single result test* Anterior Nasal Swab     Status: None   Collection Time: 01/28/23  5:17 PM   Specimen: Anterior Nasal Swab  Result Value Ref Range Status   SARS Coronavirus 2 by RT PCR NEGATIVE NEGATIVE Final    Comment: (NOTE) SARS-CoV-2 target nucleic acids are NOT DETECTED.  The SARS-CoV-2 RNA is generally detectable in upper and lower respiratory  specimens during the acute phase of infection. The lowest concentration of SARS-CoV-2 viral copies this assay can detect is 250 copies / mL. A negative result does not preclude SARS-CoV-2 infection and should not be used as the sole basis for treatment or other patient management decisions.  A negative result may occur with improper specimen collection / handling, submission of specimen other than nasopharyngeal swab, presence of viral mutation(s) within the areas targeted by this assay, and inadequate number of viral copies (<250 copies / mL). A negative result must be combined with clinical observations, patient history, and epidemiological  information.  Fact Sheet for Patients:   RoadLapTop.co.za  Fact Sheet for Healthcare Providers: http://kim-miller.com/  This test is not yet approved or  cleared by the [place] FDA and has been authorized for detection and/or diagnosis of SARS-CoV-2 by FDA under an Emergency Use Authorization (EUA).  This EUA will remain in effect (meaning this test can be used) for the duration of the COVID-19 declaration under Section 564(b)(1) of the Act, 21 U.S.C. section 360bbb-3(b)(1), unless the authorization is terminated or revoked sooner.  Performed at [facility], [address] [place], [place] [address]     Coagulation Studies: No results for input(s): "LABPROT", "INR" in the last 72 hours.  Urinalysis: No results for input(s): "COLORURINE", "LABSPEC", "PHURINE", "GLUCOSEU", "HGBUR", "BILIRUBINUR", "KETONESUR", "PROTEINUR", "UROBILINOGEN", "NITRITE", "LEUKOCYTESUR" in the last 72 hours.  Invalid input(s): "APPERANCEUR"    Imaging: CT CHEST ABDOMEN PELVIS W CONTRAST  Result Date: 02/02/2023 CLINICAL DATA:  Post knee replacement complicated by wound infection elevated blood pressure weight loss up to 10 pounds EXAM: CT CHEST, ABDOMEN, AND PELVIS WITH CONTRAST TECHNIQUE: Multidetector CT imaging of the chest, abdomen and pelvis was performed following the standard protocol during bolus administration of intravenous contrast. RADIATION DOSE REDUCTION: This exam was performed according to the departmental dose-optimization program which includes automated exposure control, adjustment of the mA and/or kV according to patient size and/or use of iterative reconstruction technique. CONTRAST:  OMNIPAQUE IOHEXOL 300 MG/ML  SOLN COMPARISON:  Ultrasound 01/31/2023, CT 07/17/2017 FINDINGS: CT CHEST FINDINGS Cardiovascular: Nonaneurysmal aorta. Normal cardiac size. No pericardial effusion Mediastinum/Nodes: No enlarged mediastinal,  hilar, or axillary lymph nodes. Thyroid gland, trachea, and esophagus demonstrate no significant findings. Lungs/Pleura: Lungs are clear. No pleural effusion or pneumothorax. Musculoskeletal: No chest wall mass or suspicious bone lesions identified. CT ABDOMEN PELVIS FINDINGS Hepatobiliary: Hepatic steatosis. No calcified gallstone or biliary dilatation Pancreas: No inflammation.  Stable prominent pancreatic duct. Spleen: Normal in size without focal abnormality. Adrenals/Urinary Tract: Adrenal glands are unremarkable. Kidneys are normal, without renal calculi, focal lesion, or hydronephrosis. Bladder is unremarkable. Stomach/Bowel: Stomach is within normal limits. Appendix appears normal. No evidence of bowel wall thickening, distention, or inflammatory changes. Vascular/Lymphatic: No significant vascular findings are present. No enlarged abdominal or pelvic lymph nodes. Mild atherosclerosis Reproductive: Uterus and bilateral adnexa are unremarkable. Other: No abdominal wall hernia or abnormality. No abdominopelvic ascites. Musculoskeletal: No acute or significant osseous findings. IMPRESSION: 1. No CT evidence for acute intrathoracic, intra-abdominal, or intrapelvic abnormality. 2. Hepatic steatosis. Electronically Signed   By: Jasmine Pang M.D.   On: 02/02/2023 18:19     Medications:    sodium chloride 75 mL/hr at 02/03/23 1331    amLODipine  10 mg Oral Daily   enoxaparin (LOVENOX) injection  40 mg Subcutaneous Q24H   feeding supplement (GLUCERNA SHAKE)  237 mL Oral TID BM   levothyroxine  50 mcg Oral Q0600   multivitamin with  minerals  1 tablet Oral Daily   acetaminophen **OR** acetaminophen, ondansetron **OR** ondansetron (ZOFRAN) IV, traMADol  Assessment/ Plan:  77 y.o. 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  Patient found to have significant hypercalcemia at admission at 14.4.   1.  Hypercalcemia.  Serum calcium at admission was 14.4.  Calcium down to 10.1 today.  CT scan chest abdomen pelvis was negative.  Serum ACE level was elevated.  However no obvious sign of sarcoidosis noted on CT chest.  Agree with the possibility of rheumatology referral as an outpatient.  Vitamin D 25 level was elevated at 128.  Patient has received calcitonin and was also given zoledronic acid by hospitalist.  Awaiting myeloma panel as well.  Okay to continue hydration for now.  2.  Acute kidney injury.  Baseline creatinine 0.8.  Renal function significantly improved.  Continue to monitor renal parameters while here.  3.  Patient curious about discharge.  Discharge could be considered tomorrow if calcium remains within the normal range and patient has stable renal function.   LOS: 5 Godfrey Tritschler 10/4/20248:25 PM

## 2023-02-03 NOTE — Care Management Important Message (Signed)
Important Message  Patient Details  Name: Bridget Howe MRN: [id number] Date of Birth: 1945/09/07   Important Message Given:  Yes - Medicare IM     Johnell Comings 02/03/2023, 2:34 PM

## 2023-02-03 NOTE — Plan of Care (Signed)
  Problem: Activity: Goal: Ability to avoid complications of mobility impairment will improve Outcome: Progressing   Problem: Coping: Goal: Ability to adjust to condition or change in health will improve Outcome: Progressing   Problem: Fluid Volume: Goal: Ability to maintain a balanced intake and output will improve Outcome: Progressing   Problem: Tissue Perfusion: Goal: Adequacy of tissue perfusion will improve Outcome: Progressing   Problem: Health Behavior/Discharge Planning: Goal: Ability to manage health-related needs will improve Outcome: Progressing   Problem: Clinical Measurements: Goal: Will remain free from infection Outcome: Progressing Goal: Diagnostic test results will improve Outcome: Progressing Goal: Respiratory complications will improve Outcome: Progressing Goal: Cardiovascular complication will be avoided Outcome: Progressing   Problem: Nutrition: Goal: Adequate nutrition will be maintained Outcome: Progressing   Problem: Coping: Goal: Level of anxiety will decrease Outcome: Progressing   Problem: Safety: Goal: Ability to remain free from injury will improve Outcome: Progressing

## 2023-02-03 NOTE — Progress Notes (Signed)
Progress Note   Patient: Bridget Howe VHQ:[id number] DOB: [birth year] DOA: 01/28/2023     5 DOS: the patient was seen and examined on 02/03/2023   Brief hospital course:  LAFAYE MCELMURRY is a 77 y.o. 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 She also feels like her vision is blurred.  She has associated nausea.   States that 2 weeks ago when she developed the infection in her knee she also had a 3-day bout of diarrhea. Since that time she has had decreased oral intake and states she has lost up to 10 pounds. Her wound infection cleared up with the Bactrim and she has since followed up with orthopedics.   On presentation she was hemodynamically stable, labs pertinent for increase in creatinine to 1.57 and elevated calcium at 14.4, mildly elevated liver enzymes with AST 104, ALT 163 and alkaline phosphatase 137.  TSH was normal.  COVID-19 PCR negative, UA unremarkable.  MRI brain was without any acute abnormality.  Patient did receive a dose of bisphosphonate and IV fluid, hypercalcemia seems very slowly improving.  Hypercalcemia labs with low appropriate parathyroid, significantly elevated vitamin D level at 128-patient was taking regular vitamin D supplement, could not remember the strength. Normal phosphorous.  Case was also discussed with nephrology and decided to continue with IV fluid and close monitoring of calcium.  Currently asymptomatic.  Patient need to stop her home calcium and vitamin D supplement for now.  10/3: Persistent hypercalcemia, 25-hydroxy Calciferol levels were within upper normal limit, elevated from 1/25 hydroxy Calciferol, stable but persistently elevated mild creatinine and liver  enzymes.  Ordered CT chest, abdomen and pelvis, also ordered multiple labs to rule out any underlying occult malignancy especially lymphoma or sarcoidosis.  Patient was also drinking a lot of milk and eat cheese-advised to limit the use and holding vitamin D supplement. Also started on calcitonin subcu.  10/4: Corrected calcium 10.7 today, ACE levels came back elevated at 105, improving transaminitis and AKI resolved.  Urine calcium and creatinine ratio elevated at 661.  Rest of the labs pending.  Patient feels very tremulous after getting contrast with CT chest yesterday.  CT chest abdomen and pelvis was without any abnormality.  Discussed about getting an MRI brain with contrast to see any perineural enhancement, patient would like to wait due to the recent contrast and not feeling well afterward.  No eye symptoms but patient was advised to follow-up with his ophthalmologist as outpatient.  Patient will get benefit from outpatient rheumatology evaluation.  No pulmonary sarcoidosis but sarcoidosis involving bone or other parts of the body might be a possibility.  Assessment and Plan:  * Vertigo In the setting of AKI and dehydration. Improved Continue gentle IV hydration. Encourage oral diet, supplements. PT evaluation, out of bed to chair.     Hypercalcemia Unclear etiology, PTH is low at 4 which rules out a parathyroid adenoma.  Patient is on vitamin D supplements which may increase calcium level. Vitamin D, 1, 25 dihydroxy level is elevated at 128 but 25-hydroxy levels were within upper normal limit. Proving calcium level, currently corrected at 10.7  s/p 1 dose of zoledronic acid and 2 doses of calcitonin Started workup for any occult malignancy especially sarcoidosis  or lymphoma.  His levels are elevated, elevated urinary calcium and creatinine ratio. Giving just 1 more dose of calcitonin Patient will get benefit from outpatient rheumatology workup as CT abdomen, chest and pelvis was  without any abnormality. -Whole-body bone scan ordered -Monitor calcium  AKI (acute kidney injury) (HCC) In the setting of poor oral intake, dehydration and bactrim use.   Baseline serum creatinine is 0.8 and on admission it was 1.5 Improved Stop Celebrex. Avoid all nephrotoxic drugs. Monitor daily renal function-     Unintentional weight loss Patient lost 10 pounds in the past couple weeks likely due to poor oral intake and GI illness a couple weeks prior however with hypercalcemia, started malignancy workup.     Elevated liver enzymes Possibly related to adverse effect of Bactrim, Tylenol for pain IV hydration and monitor Liver functions stable with a small improvement but remained elevated. Trend LFTs.   Adverse effect of sulfamethoxazole/trimethoprim Suspect causing both abnormal renal function and abnormal liver function Patient no longer on Bactrim, completed 3 days ago.   Prediabetes A1c 5.5. Consistent carbohydrate diet.   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 Recently followed up with Ortho -no further antibiotics or surgical intervention needed.   HTN (hypertension) Blood pressure remained mildly elevated Continue hydralazine 3 times daily Increase the dose of amlodipine to 10 mg daily   Coronary artery disease Continue aspirin. History of an MI at age 67   OSA on CPAP CPAP nightly   Hypothyroid Continue levothyroxine     Exertional chest pain Improves at rest Obtain twelve-lead EKG to rule out an acute coronary syndrome. (Twelve-lead EKG reviewed and shows nonspecific T wave changes, no acute ST or T wave changes) Troponin negative      Subjective: Patient was seen and examined today.  Still feeling weak.  She was very tremulous after getting contrast yesterday with CT scan.  Symptoms improved after midnight.  Physical Exam: Vitals:   02/02/23 2015  02/03/23 0307 02/03/23 0818 02/03/23 1157  BP: 138/60 136/60 (!) 121/56 (!) 151/75  Pulse: 73 63 65 (!) 59  Resp: 18 18 19 18   Temp: 98.3 F (36.8 C) 97.9 F (36.6 C) 97.8 F (36.6 C) 97.8 F (36.6 C)  TempSrc: Oral  Oral Oral  SpO2: 94% 97% 94% 97%  Weight:      Height:       General.  Well-developed elderly lady, in no acute distress. Pulmonary.  Lungs clear bilaterally, normal respiratory effort. CV.  Regular rate and rhythm, no JVD, rub or murmur. Abdomen.  Soft, nontender, nondistended, BS positive. CNS.  Alert and oriented .  No focal neurologic deficit. Extremities.  No edema, no cyanosis, pulses intact and symmetrical. Psychiatry.  Judgment and insight appears normal.    Data Reviewed: Prior data reviewed.  Family Communication: Discussed with husband at bedside.  Disposition: Status is: Inpatient Remains inpatient appropriate because: Treatment of hypercalcemia  Planned Discharge Destination: Home  DVT prophylaxis.  Lovenox Time spent: 50 minutes  This record has been created using Conservation officer, historic buildings. Errors have been sought and corrected,but may not always be located. Such creation errors do not reflect on the standard of care.   Author: Arnetha Courser, MD 02/03/2023 3:43 PM  For on call review [email or web address].

## 2023-02-04 DIAGNOSIS — R531 Weakness: Principal | ICD-10-CM

## 2023-02-04 DIAGNOSIS — N179 Acute kidney failure, unspecified: Secondary | ICD-10-CM | POA: Diagnosis not present

## 2023-02-04 DIAGNOSIS — R42 Dizziness and giddiness: Secondary | ICD-10-CM | POA: Diagnosis not present

## 2023-02-04 LAB — COMPREHENSIVE METABOLIC PANEL
ALT: 63 U/L — ABNORMAL HIGH (ref 0–44)
AST: 31 U/L (ref 15–41)
Albumin: 3.1 g/dL — ABNORMAL LOW (ref 3.5–5.0)
Alkaline Phosphatase: 74 U/L (ref 38–126)
Anion gap: 6 (ref 5–15)
BUN: 16 mg/dL (ref 8–23)
CO2: 26 mmol/L (ref 22–32)
Calcium: 9.9 mg/dL (ref 8.9–10.3)
Chloride: 105 mmol/L (ref 98–111)
Creatinine, Ser: 0.83 mg/dL (ref 0.44–1.00)
GFR, Estimated: >60 mL/min (ref >60)
Glucose, Bld: 110 mg/dL — ABNORMAL HIGH (ref 70–99)
Potassium: 3.2 mmol/L — ABNORMAL LOW (ref 3.5–5.1)
Sodium: 137 mmol/L (ref 135–145)
Total Bilirubin: 0.7 mg/dL (ref 0.3–1.2)
Total Protein: 6.6 g/dL (ref 6.5–8.1)

## 2023-02-04 MED ORDER — GLUCERNA SHAKE PO LIQD: 237.0000 mL | Freq: Three times a day (TID) | ORAL | 11 refills | Status: AC

## 2023-02-04 MED ORDER — AMLODIPINE BESYLATE 10 MG PO TABS: 10.0000 mg | ORAL_TABLET | Freq: Every day | ORAL | 1 refills | Status: AC

## 2023-02-04 MED ORDER — ONDANSETRON HCL 4 MG PO TABS: 4.0000 mg | ORAL_TABLET | Freq: Four times a day (QID) | ORAL | 0 refills | Status: AC | PRN

## 2023-02-04 MED ORDER — POTASSIUM CHLORIDE CRYS ER 20 MEQ PO TBCR
40.0000 meq | EXTENDED_RELEASE_TABLET | Freq: Once | ORAL | Status: AC
  Administered 2023-02-04: 40 meq via ORAL
  Filled 2023-02-04: qty 2

## 2023-02-04 NOTE — Discharge Summary (Signed)
Physician Discharge Summary   Patient: Bridget Howe MRN: [id number] DOB: 12-12-1945  Admit date:     01/28/2023  Discharge date: 02/04/23  Discharge Physician: Arnetha Courser   PCP: Lauro Regulus, MD   Recommendations at discharge:  Please obtain CBC and CMP within next couple of days Patient need close monitoring of her calcium levels Multiple labs which include SPEP/UPEP, multiple myeloma panel, parathyroid related hormone are still pending-please follow-up. Patient will likely need whole-body bone scan and a repeat MRI brain with contrast, she developed some nausea and tremors after getting contrast for CT scan so would like to wait few more days.  She might need a Benadryl with or without steroid before those studies, her PCP or oncology can place those orders. Patient will get benefit from seeing a rheumatologist to rule out any occult sarcoidosis as her ACE levels were elevated. Patient need to follow-up with ophthalmology to rule out any lesion in her eye. Follow-up with primary care provider Follow-up with oncology-case was discussed with Dr. Michae Kava. Follow-up with rheumatology-PCP can provide the referral and a close follow-up appointment.  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 *  Hospital Course:  Bridget Howe is a 77 y.o. female with medical history significant for OSA on CPAP, hypothyroidism, prediabetes, who is s/p right knee replacement 12/14/2022 complicated by wound infection around 9/15 for which she completed a 2-week course of Bactrim  3 days ago on  who presents to the ED with a 3-day history of dizziness and feeling off balance. She also feels like her vision is blurred.  She has associated nausea.   States that 2 weeks ago when she developed the infection in her knee she also had a 3-day bout of diarrhea. Since that time she has had decreased oral intake and states she has lost up to 10 pounds. Her wound infection cleared up with the Bactrim and she has since followed up with orthopedics.   On presentation she was hemodynamically stable, labs pertinent for increase in creatinine to 1.57 and elevated calcium at 14.4, mildly elevated liver enzymes with AST 104, ALT 163 and alkaline phosphatase 137.  TSH was normal.  COVID-19 PCR negative, UA unremarkable.  MRI brain was without any acute abnormality.  Patient did receive a dose of bisphosphonate and IV fluid, hypercalcemia seems very slowly improving.  Hypercalcemia labs with low appropriate parathyroid, significantly elevated vitamin D level at 128-patient was taking regular vitamin D supplement, Normal phosphorous.  Case was also discussed with nephrology and decided to continue with IV fluid and close monitoring of calcium.  Currently asymptomatic.  Patient need to stop her home calcium and vitamin D supplement for now.  10/3: Persistent hypercalcemia, 25-hydroxy Calciferol levels were within upper normal limit, elevated from 1/25 hydroxy Calciferol, stable but persistently elevated mild creatinine and liver enzymes.  Ordered CT chest, abdomen and pelvis, also ordered multiple labs to rule out any underlying occult malignancy especially lymphoma or sarcoidosis.  Patient was also drinking a lot of milk and eat cheese-advised to limit the use and holding vitamin D supplement. Also  started on calcitonin subcu.  10/4: Corrected calcium 10.7 today, ACE levels came back elevated at 105, improving transaminitis and AKI resolved.  Urine calcium and creatinine ratio elevated at 661.  Rest of the labs  pending.  Patient feels very tremulous after getting contrast with CT chest yesterday.  CT chest abdomen and pelvis was without any abnormality.  Discussed about getting an MRI brain with contrast to see any perineural enhancement, patient would like to wait due to the recent contrast and not feeling well afterward.  No eye symptoms but patient was advised to follow-up with his ophthalmologist as outpatient.  Patient will get benefit from outpatient rheumatology evaluation.  No pulmonary sarcoidosis but sarcoidosis involving bone or other parts of the body might be a possibility.  10/5: Corrected calcium 10.6 today.  Elevated kappa chain with increased kappa lambda ratio at 1.84, multiple myeloma panel still pending, discussed with Dr. Alena Bills from cancer center and they will see her as outpatient.  Patient still had pending multiple myeloma panel, parathyroid related hormone, SPEP and UPEP, labs.  Discussed with nephrology and there is no further doses of calcitonin required at this time.  Patient need to limit her calcium intake.  Stop taking vitamin D supplement.  Patient need further workup to rule out underlying sarcoidosis and any other occult malignancy especially lymphoma or multiple myeloma.  She was advised to follow-up with rheumatology and oncology for further workup.  She will likely need a repeat brain MRI with contrast and a whole-body bone scan.  She was also advised to follow-up with her ophthalmologist to rule out any abnormality in the eye as we are looking for any occult sarcoidosis.  She was also started on antihypertensive amlodipine during current hospitalization and her PCP can monitor and titrate.  Patient clinically feels much improved.  She wants to go home so she is being discharged home and she will complete her workup as outpatient.  She will continue with rest of her home medications and need to have a close follow-up with outpatient providers which should include primary  care doctor, rheumatology and oncology for further workup.  She will also need a close monitoring of her calcium levels.  Assessment and Plan: * Vertigo Possible symptomatic hypercalcemia, dehydration MRI negative for CVA IV hydration PT consult  Hypercalcemia Unclear etiology, PTH is low at 4 which rules out a parathyroid adenoma.  Patient is on vitamin D supplements which may increase calcium level. Vitamin D, 1, 25 dihydroxy level is elevated at 128 but 25-hydroxy levels were within upper normal limit.  s/p 1 dose of zoledronic acid and 3 doses of calcitonin Started workup for any occult malignancy especially sarcoidosis or lymphoma.  ACE levels are elevated, elevated urinary calcium and creatinine ratio.  Elevated kappa and lambda ratio.  Corrected calcium slowly improving, currently at 10.6 on the day of discharge.  Transaminitis improving and AKI has been resolved. CT chest, abdomen and pelvis with contrast was negative for any acute abnormality. Patient did develop some intolerance to contrast and would like to wait a few days before repeating MRI brain with contrast and whole-body bone scan. Case was discussed with oncology and they will follow-up as outpatient. Patient need outpatient follow-up with oncology and rheumatology to rule out any occult sarcoidosis, lymphoma or multiple myeloma.  AKI (acute kidney injury) (HCC) Resolved  Unintentional weight loss Patient lost 10 pounds in the past couple weeks likely due to poor oral intake and GI illness a couple weeks prior however  with hypercalcemia, might consider malignancy workup if over will symptoms not improving with hydration  Elevated liver enzymes Possibly related to adverse effect of Bactrim, Tylenol for pain CT abdomen with liver steatosis. Liver enzymes improving.  Adverse effect of sulfamethoxazole/trimethoprim Suspect causing both abnormal renal function and abnormal liver function Patient no longer on Bactrim  which she completed 3 days prior  Prediabetes A1c of 5.5.  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  Coronary artery disease Continue aspirin History of an MI at age 5  OSA on CPAP CPAP nightly  Hypothyroid Continue levothyroxine       Consultants: Nephrology.  Curbside oncology Procedures performed: None Disposition: Home Diet recommendation:  Discharge Diet Orders (From admission, onward)     Start     Ordered   02/04/23 0000  Diet - low sodium heart healthy        02/04/23 1242           Cardiac and Carb modified diet DISCHARGE MEDICATION: Allergies as of 02/04/2023       Reactions   Hydralazine Hcl Other (See Comments)   Headache, nausea, blurred vision   Influenza Vaccines Anaphylaxis   Caffeine    Copper-containing Compounds    Covid-19 (subunit) Vaccine Other (See Comments)   Chest pain   Egg-derived Products Swelling   Gabapentin Other (See Comments)   Nexium [esomeprazole] Cough   Joint pain   Nickel    Propofol Other (See Comments)   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   amLODipine 10 MG tablet Commonly known as: NORVASC Take 1 tablet (10 mg total) by  mouth daily. Start taking on: February 05, 2023   aspirin EC 81 MG tablet Take 1 tablet (81 mg total) by mouth in the morning and at bedtime.   b complex vitamins capsule Take 1 capsule by mouth daily.   feeding supplement (GLUCERNA SHAKE) Liqd Take 237 mLs by mouth 3 (three) times daily between meals.   Fiber Adult Gummies 2 g Chew Chew 2 tablets by mouth at bedtime.   fluticasone 50 MCG/ACT nasal spray Commonly known as: FLONASE Place 2 sprays into both nostrils daily.   Krill Oil 500 MG Caps Take 1 capsule by mouth daily.   levothyroxine 50 MCG tablet Commonly known as: SYNTHROID Take 50 mcg by mouth daily before breakfast.   ondansetron 4 MG tablet Commonly known as: ZOFRAN Take 1 tablet (4 mg total) by mouth every 6 (six) hours as needed for nausea.   OSTEO BI-FLEX ADV DOUBLE ST PO Take 1 tablet by mouth 2 (two) times daily.   traMADol 50 MG tablet Commonly known as: ULTRAM Take  1-2 tablets (50-100 mg total) by mouth every 4 (four) hours as needed for moderate pain.        Follow-up Information     Lauro Regulus, MD. Schedule an appointment as soon as possible for a visit in 1 week(s).   Specialty: Internal Medicine Contact information: [address] [facility] [place] [address] [phone]         Michaelyn Barter, MD. Schedule an appointment as soon as possible for a visit.   Specialty: Oncology Contact information: [address] [place] [address] [phone]                Discharge Exam: Ceasar Mons Weights   01/29/23 1153 01/30/23 0413 02/04/23 0500  Weight: 68.3 kg 68.9 kg 67.6 kg   General.  Well-developed lady, in no acute distress. Pulmonary.  Lungs clear bilaterally, normal respiratory effort. CV.  Regular rate and rhythm, no JVD, rub or murmur. Abdomen.  Soft, nontender, nondistended, BS positive. CNS.  Alert and oriented .  No focal neurologic deficit. Extremities.  No edema, no cyanosis,  pulses intact and symmetrical. Psychiatry.  Judgment and insight appears normal.   Condition at discharge: stable  The results of significant diagnostics from this hospitalization (including imaging, microbiology, ancillary and laboratory) are listed below for reference.   Imaging Studies: CT CHEST ABDOMEN PELVIS W CONTRAST  Result Date: 02/02/2023 CLINICAL DATA:  Post knee replacement complicated by wound infection elevated blood pressure weight loss up to 10 pounds EXAM: CT CHEST, ABDOMEN, AND PELVIS WITH CONTRAST TECHNIQUE: Multidetector CT imaging of the chest, abdomen and pelvis was performed following the standard protocol during bolus administration of intravenous contrast. RADIATION DOSE REDUCTION: This exam was performed according to the departmental dose-optimization program which includes automated exposure control, adjustment of the mA and/or kV according to patient size and/or use of iterative reconstruction technique. CONTRAST:  OMNIPAQUE IOHEXOL 300 MG/ML  SOLN COMPARISON:  Ultrasound 01/31/2023, CT 07/17/2017 FINDINGS: CT CHEST FINDINGS Cardiovascular: Nonaneurysmal aorta. Normal cardiac size. No pericardial effusion Mediastinum/Nodes: No enlarged mediastinal, hilar, or axillary lymph nodes. Thyroid gland, trachea, and esophagus demonstrate no significant findings. Lungs/Pleura: Lungs are clear. No pleural effusion or pneumothorax. Musculoskeletal: No chest wall mass or suspicious bone lesions identified. CT ABDOMEN PELVIS FINDINGS Hepatobiliary: Hepatic steatosis. No calcified gallstone or biliary dilatation Pancreas: No inflammation.  Stable prominent pancreatic duct. Spleen: Normal in size without focal abnormality. Adrenals/Urinary Tract: Adrenal glands are unremarkable. Kidneys are normal, without renal calculi, focal lesion, or hydronephrosis. Bladder is unremarkable. Stomach/Bowel: Stomach is within normal limits. Appendix appears normal. No evidence of bowel wall thickening,  distention, or inflammatory changes. Vascular/Lymphatic: No significant vascular findings are present. No enlarged abdominal or pelvic lymph nodes. Mild atherosclerosis Reproductive: Uterus and bilateral adnexa are unremarkable. Other: No abdominal wall hernia or abnormality. No abdominopelvic ascites. Musculoskeletal: No acute or significant osseous findings. IMPRESSION: 1. No CT evidence for acute intrathoracic, intra-abdominal, or intrapelvic abnormality. 2. Hepatic steatosis. Electronically Signed   By: Jasmine Pang M.D.   On: 02/02/2023 18:19   US Abdomen Limited RUQ (LIVER/GB)  Result Date: 01/31/2023 CLINICAL DATA:  Transaminitis. EXAM: ULTRASOUND ABDOMEN LIMITED RIGHT UPPER QUADRANT COMPARISON:  June 29, 2022. FINDINGS: Gallbladder: No gallstones or wall thickening visualized. No sonographic Murphy sign noted by sonographer. Common bile duct: Diameter: 3 mm which is within normal limits. Liver: No focal lesion identified. Within normal limits in parenchymal echogenicity. Portal vein is patent on color Doppler imaging with normal direction  of blood flow towards the liver. Other: None. IMPRESSION: No definite abnormality seen in the right upper quadrant of the abdomen. Electronically Signed   By: Lupita Raider M.D.   On: 01/31/2023 12:58   MR BRAIN WO CONTRAST  Result Date: 01/28/2023 CLINICAL DATA:  Dizziness over the last 2 days which is constant. EXAM: MRI HEAD WITHOUT CONTRAST TECHNIQUE: Multiplanar, multiecho pulse sequences of the brain and surrounding structures were obtained without intravenous contrast. COMPARISON:  10/10/2014 FINDINGS: Brain: Diffusion imaging does not show any acute or subacute infarction. There are mild chronic small-vessel ischemic changes of the pons. 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 Cerebral hemispheres show minimal chronic small-vessel change of the white matter. No cortical or large vessel territory infarction. No mass  lesion, hemorrhage, hydrocephalus or extra-axial collection. Vascular: Major vessels at the base of the brain show flow. Skull and upper cervical spine: Negative Sinuses/Orbits: Clear/normal Other: No fluid in the middle ears or mastoid air cells. IMPRESSION: No acute or reversible finding. Mild chronic small-vessel ischemic changes of the pons and cerebellum. Minimal chronic small-vessel change of the cerebral hemispheric white matter. Electronically Signed   By: Paulina Fusi M.D.   On: 01/28/2023 21:01    Microbiology: Results for orders placed or performed during the hospital encounter of 01/28/23  SARS Coronavirus 2 by RT PCR (hospital order, performed in [facility] hospital lab) *cepheid single result test* Anterior Nasal Swab     Status: None   Collection Time: 01/28/23  5:17 PM   Specimen: Anterior Nasal Swab  Result Value Ref Range Status   SARS Coronavirus 2 by RT PCR NEGATIVE NEGATIVE Final    Comment: (NOTE) SARS-CoV-2 target nucleic acids are NOT DETECTED.  The SARS-CoV-2 RNA is generally detectable in upper and lower respiratory specimens during the acute phase of infection. The lowest concentration of SARS-CoV-2 viral copies this assay can detect is 250 copies / mL. A negative result does not preclude SARS-CoV-2 infection and should not be used as the sole basis for treatment or other patient management decisions.  A negative result may occur with improper specimen collection / handling, submission of specimen other than nasopharyngeal swab, presence of viral mutation(s) within the areas targeted by this assay, and inadequate number of viral copies (<250 copies / mL). A negative result must be combined with clinical observations, patient history, and epidemiological information.  Fact Sheet for Patients:   RoadLapTop.co.za  Fact Sheet for Healthcare Providers: http://kim-miller.com/  This test is not yet approved or  cleared by  the [place] FDA and has been authorized for detection and/or diagnosis of SARS-CoV-2 by FDA under an Emergency Use Authorization (EUA).  This EUA will remain in effect (meaning this test can be used) for the duration of the COVID-19 declaration under Section 564(b)(1) of the Act, 21 U.S.C. section 360bbb-3(b)(1), unless the authorization is terminated or revoked sooner.  Performed at [facility], [address] [place], [place] [address]     Labs: CBC: Recent Labs  Lab 01/28/23 1609 01/29/23 0411 02/02/23 0632  WBC 7.8 5.6 7.2  HGB 13.6 11.1* 12.8  HCT 40.3 33.0* 37.9  MCV 94.8 95.9 94.3  PLT 372 295 365   Basic Metabolic Panel: Recent Labs  Lab 01/31/23 0942 02/01/23 0558 02/02/23 0632 02/03/23 0640 02/04/23 0548  NA 141 138 140 138 137  K 3.7 3.7 4.1 3.7 3.2*  CL 106 106 104 101 105  CO2 28 27 28  26  26  GLUCOSE 104* 106* 112* 101* 110*  BUN 25* 30* 33* 19 16  CREATININE 1.27* 1.30* 1.29* 0.96 0.83  CALCIUM 12.2* 12.0* 12.1* 10.1 9.9  MG  --   --  2.0  --   --   PHOS  --  2.8  --   --   --    Liver Function Tests: Recent Labs  Lab 01/29/23 0411 01/30/23 0459 02/02/23 0632 02/03/23 0640 02/04/23 0548  AST 74* 73* 58* 41 31  ALT 118* 123* 107* 77* 63*  ALKPHOS 103 117 91 88 74  BILITOT 0.4 0.4 0.2* 0.7 0.7  PROT 5.9* 6.6 6.9 6.7 6.6  ALBUMIN 2.7* 3.1* 3.2* 3.3* 3.1*   CBG: Recent Labs  Lab 02/01/23 0804 02/01/23 1145 02/01/23 1616 02/01/23 2035 02/02/23 0819  GLUCAP 120* 86 136* 100* 88    Discharge time spent: greater than 30 minutes.  This record has been created using Conservation officer, historic buildings. Errors have been sought and corrected,but may not always be located. Such creation errors do not reflect on the standard of care.   Signed: Arnetha Courser, MD [facility] 02/04/2023

## 2023-02-04 NOTE — Progress Notes (Signed)
[facility]  ROUNDING NOTE   Subjective:   Patient resting in bed Alert and oriented No family at bedside Tolerating meals without nausea or vomiting  S Ca- 9.9  Objective:  Vital signs in last 24 hours:  Temp:  [97.7 F (36.5 C)-98.4 F (36.9 C)] 97.7 F (36.5 C) (10/05 1103) Pulse Rate:  [61-65] 64 (10/05 1103) Resp:  [17-20] 20 (10/05 1103) BP: (149-153)/(65-88) 153/88 (10/05 1103) SpO2:  [94 %-98 %] 97 % (10/05 1103) Weight:  [67.6 kg] 67.6 kg (10/05 0500)  Weight change:  Filed Weights   01/29/23 1153 01/30/23 0413 02/04/23 0500  Weight: 68.3 kg 68.9 kg 67.6 kg    Intake/Output: I/O last 3 completed shifts: In: 1731.7 [P.O.:560; I.V.:1171.7] Out: 200 [Urine:200]   Intake/Output this shift:  No intake/output data recorded.  Physical Exam: General: No acute distress  Head: Normocephalic, atraumatic. Moist oral mucosal membranes  Lungs:  Nonlabored  Heart: S1S2   Abdomen:  Soft, nontender, bowel sounds present  Extremities: No peripheral edema.  Neurologic: Awake, alert, following commands  Skin: No acute rash  Access: No hemodialysis access    Basic Metabolic Panel: Recent Labs  Lab 01/31/23 0942 02/01/23 0558 02/02/23 0632 02/03/23 0640 02/04/23 0548  NA 141 138 140 138 137  K 3.7 3.7 4.1 3.7 3.2*  CL 106 106 104 101 105  CO2 28 27 28 26 26   GLUCOSE 104* 106* 112* 101* 110*  BUN 25* 30* 33* 19 16  CREATININE 1.27* 1.30* 1.29* 0.96 0.83  CALCIUM 12.2* 12.0* 12.1* 10.1 9.9  MG  --   --  2.0  --   --   PHOS  --  2.8  --   --   --     Liver Function Tests: Recent Labs  Lab 01/29/23 0411 01/30/23 0459 02/02/23 0632 02/03/23 0640 02/04/23 0548  AST 74* 73* 58* 41 31  ALT 118* 123* 107* 77* 63*  ALKPHOS 103 117 91 88 74  BILITOT 0.4 0.4 0.2* 0.7 0.7  PROT 5.9* 6.6 6.9 6.7 6.6  ALBUMIN 2.7* 3.1* 3.2* 3.3* 3.1*   No results for input(s): "LIPASE", "AMYLASE" in the last 168 hours. No results for input(s): "AMMONIA" in the  last 168 hours.  CBC: Recent Labs  Lab 01/28/23 1609 01/29/23 0411 02/02/23 0632  WBC 7.8 5.6 7.2  HGB 13.6 11.1* 12.8  HCT 40.3 33.0* 37.9  MCV 94.8 95.9 94.3  PLT 372 295 365    Cardiac Enzymes: No results for input(s): "CKTOTAL", "CKMB", "CKMBINDEX", "TROPONINI" in the last 168 hours.  BNP: Invalid input(s): "POCBNP"  CBG: Recent Labs  Lab 02/01/23 0804 02/01/23 1145 02/01/23 1616 02/01/23 2035 02/02/23 0819  GLUCAP 120* 86 136* 100* 88    Microbiology: Results for orders placed or performed during the hospital encounter of 01/28/23  SARS Coronavirus 2 by RT PCR (hospital order, performed in [facility] hospital lab) *cepheid single result test* Anterior Nasal Swab     Status: None   Collection Time: 01/28/23  5:17 PM   Specimen: Anterior Nasal Swab  Result Value Ref Range Status   SARS Coronavirus 2 by RT PCR NEGATIVE NEGATIVE Final    Comment: (NOTE) SARS-CoV-2 target nucleic acids are NOT DETECTED.  The SARS-CoV-2 RNA is generally detectable in upper and lower respiratory specimens during the acute phase of infection. The lowest concentration of SARS-CoV-2 viral copies this assay can detect is 250 copies / mL. A negative result does not preclude SARS-CoV-2 infection and should not  be used as the sole basis for treatment or other patient management decisions.  A negative result may occur with improper specimen collection / handling, submission of specimen other than nasopharyngeal swab, presence of viral mutation(s) within the areas targeted by this assay, and inadequate number of viral copies (<250 copies / mL). A negative result must be combined with clinical observations, patient history, and epidemiological information.  Fact Sheet for Patients:   RoadLapTop.co.za  Fact Sheet for Healthcare Providers: http://kim-miller.com/  This test is not yet approved or  cleared by the [place] FDA and has been  authorized for detection and/or diagnosis of SARS-CoV-2 by FDA under an Emergency Use Authorization (EUA).  This EUA will remain in effect (meaning this test can be used) for the duration of the COVID-19 declaration under Section 564(b)(1) of the Act, 21 U.S.C. section 360bbb-3(b)(1), unless the authorization is terminated or revoked sooner.  Performed at [facility], [address] [place], [place] [address]     Coagulation Studies: No results for input(s): "LABPROT", "INR" in the last 72 hours.  Urinalysis: No results for input(s): "COLORURINE", "LABSPEC", "PHURINE", "GLUCOSEU", "HGBUR", "BILIRUBINUR", "KETONESUR", "PROTEINUR", "UROBILINOGEN", "NITRITE", "LEUKOCYTESUR" in the last 72 hours.  Invalid input(s): "APPERANCEUR"    Imaging: CT CHEST ABDOMEN PELVIS W CONTRAST  Result Date: 02/02/2023 CLINICAL DATA:  Post knee replacement complicated by wound infection elevated blood pressure weight loss up to 10 pounds EXAM: CT CHEST, ABDOMEN, AND PELVIS WITH CONTRAST TECHNIQUE: Multidetector CT imaging of the chest, abdomen and pelvis was performed following the standard protocol during bolus administration of intravenous contrast. RADIATION DOSE REDUCTION: This exam was performed according to the departmental dose-optimization program which includes automated exposure control, adjustment of the mA and/or kV according to patient size and/or use of iterative reconstruction technique. CONTRAST:  OMNIPAQUE IOHEXOL 300 MG/ML  SOLN COMPARISON:  Ultrasound 01/31/2023, CT 07/17/2017 FINDINGS: CT CHEST FINDINGS Cardiovascular: Nonaneurysmal aorta. Normal cardiac size. No pericardial effusion Mediastinum/Nodes: No enlarged mediastinal, hilar, or axillary lymph nodes. Thyroid gland, trachea, and esophagus demonstrate no significant findings. Lungs/Pleura: Lungs are clear. No pleural effusion or pneumothorax. Musculoskeletal: No chest wall mass or suspicious bone lesions identified. CT  ABDOMEN PELVIS FINDINGS Hepatobiliary: Hepatic steatosis. No calcified gallstone or biliary dilatation Pancreas: No inflammation.  Stable prominent pancreatic duct. Spleen: Normal in size without focal abnormality. Adrenals/Urinary Tract: Adrenal glands are unremarkable. Kidneys are normal, without renal calculi, focal lesion, or hydronephrosis. Bladder is unremarkable. Stomach/Bowel: Stomach is within normal limits. Appendix appears normal. No evidence of bowel wall thickening, distention, or inflammatory changes. Vascular/Lymphatic: No significant vascular findings are present. No enlarged abdominal or pelvic lymph nodes. Mild atherosclerosis Reproductive: Uterus and bilateral adnexa are unremarkable. Other: No abdominal wall hernia or abnormality. No abdominopelvic ascites. Musculoskeletal: No acute or significant osseous findings. IMPRESSION: 1. No CT evidence for acute intrathoracic, intra-abdominal, or intrapelvic abnormality. 2. Hepatic steatosis. Electronically Signed   By: Jasmine Pang M.D.   On: 02/02/2023 18:19     Medications:      amLODipine  10 mg Oral Daily   enoxaparin (LOVENOX) injection  40 mg Subcutaneous Q24H   feeding supplement (GLUCERNA SHAKE)  237 mL Oral TID BM   levothyroxine  50 mcg Oral Q0600   multivitamin with minerals  1 tablet Oral Daily   acetaminophen **OR** acetaminophen, ondansetron **OR** ondansetron (ZOFRAN) IV, traMADol  Assessment/ Plan:  77 y.o. 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  Patient found to have significant hypercalcemia at admission at 14.4.   1.  Hypercalcemia.  Serum calcium at admission was 14.4.  Calcium down to 10.1 today.  CT scan  chest abdomen pelvis was negative.  Serum ACE level was elevated.  However no obvious sign of sarcoidosis noted on CT chest.  Agree with the possibility of rheumatology referral as an outpatient.  Vitamin D 25 level was elevated at 128.  Patient has received calcitonin and was also given zoledronic acid by hospitalist.  Awaiting myeloma panel as well.    S Ca 9.9, corrected calcium remains elevated. Will require close outpatient follow up  2.  Acute kidney injury.  Baseline creatinine 0.8.  Renal function significantly improved.  Creatinine remains stable    LOS: 6 Bridget Howe 10/5/202412:32 PM

## 2023-02-05 LAB — MULTIPLE MYELOMA PANEL, SERUM
Albumin SerPl Elph-Mcnc: 3.3 g/dL (ref 2.9–4.4)
Albumin/Glob SerPl: 1 (ref 0.7–1.7)
Alpha 1: 0.4 g/dL (ref 0.0–0.4)
Alpha2 Glob SerPl Elph-Mcnc: 0.9 g/dL (ref 0.4–1.0)
B-Globulin SerPl Elph-Mcnc: 1 g/dL (ref 0.7–1.3)
Gamma Glob SerPl Elph-Mcnc: 1.2 g/dL (ref 0.4–1.8)
Globulin, Total: 3.5 g/dL (ref 2.2–3.9)
IgA: 180 mg/dL (ref 64–422)
IgG (Immunoglobin G), Serum: 1241 mg/dL (ref 586–1602)
IgM (Immunoglobulin M), Srm: 103 mg/dL (ref 26–217)
Total Protein ELP: 6.8 g/dL (ref 6.0–8.5)

## 2023-02-05 LAB — PROTEIN ELECTROPHORESIS, SERUM
A/G Ratio: 0.9 (ref 0.7–1.7)
Albumin ELP: 3.1 g/dL (ref 2.9–4.4)
Alpha-1-Globulin: 0.4 g/dL (ref 0.0–0.4)
Alpha-2-Globulin: 0.9 g/dL (ref 0.4–1.0)
Beta Globulin: 1 g/dL (ref 0.7–1.3)
Gamma Globulin: 1.2 g/dL (ref 0.4–1.8)
Globulin, Total: 3.5 g/dL (ref 2.2–3.9)
Total Protein ELP: 6.6 g/dL (ref 6.0–8.5)

## 2023-02-05 LAB — VITAMIN A: Vitamin A (Retinoic Acid): 49.9 ug/dL (ref 22.0–69.5)

## 2023-02-06 ENCOUNTER — Other Ambulatory Visit: Payer: PPO

## 2023-02-06 ENCOUNTER — Ambulatory Visit: Payer: PPO

## 2023-02-06 DIAGNOSIS — M25561 Pain in right knee: Secondary | ICD-10-CM | POA: Diagnosis not present

## 2023-02-08 DIAGNOSIS — Z961 Presence of intraocular lens: Secondary | ICD-10-CM | POA: Diagnosis not present

## 2023-02-08 DIAGNOSIS — H43813 Vitreous degeneration, bilateral: Secondary | ICD-10-CM | POA: Diagnosis not present

## 2023-02-10 ENCOUNTER — Encounter: Payer: Self-pay | Admitting: Internal Medicine

## 2023-02-10 ENCOUNTER — Inpatient Hospital Stay: Payer: PPO

## 2023-02-10 ENCOUNTER — Inpatient Hospital Stay: Payer: PPO | Attending: Internal Medicine | Admitting: Internal Medicine

## 2023-02-10 VITALS — BP 134/70 | HR 76 | Temp 97.8°F | Wt 152.0 lb

## 2023-02-10 DIAGNOSIS — Z79899 Other long term (current) drug therapy: Secondary | ICD-10-CM | POA: Diagnosis not present

## 2023-02-10 DIAGNOSIS — Z803 Family history of malignant neoplasm of breast: Secondary | ICD-10-CM | POA: Insufficient documentation

## 2023-02-10 DIAGNOSIS — E039 Hypothyroidism, unspecified: Secondary | ICD-10-CM | POA: Insufficient documentation

## 2023-02-10 DIAGNOSIS — R7303 Prediabetes: Secondary | ICD-10-CM | POA: Insufficient documentation

## 2023-02-10 DIAGNOSIS — G4733 Obstructive sleep apnea (adult) (pediatric): Secondary | ICD-10-CM | POA: Diagnosis not present

## 2023-02-10 DIAGNOSIS — I2510 Atherosclerotic heart disease of native coronary artery without angina pectoris: Secondary | ICD-10-CM | POA: Insufficient documentation

## 2023-02-10 DIAGNOSIS — R768 Other specified abnormal immunological findings in serum: Secondary | ICD-10-CM | POA: Insufficient documentation

## 2023-02-10 DIAGNOSIS — Z87891 Personal history of nicotine dependence: Secondary | ICD-10-CM | POA: Insufficient documentation

## 2023-02-10 DIAGNOSIS — Z96651 Presence of right artificial knee joint: Secondary | ICD-10-CM | POA: Insufficient documentation

## 2023-02-10 DIAGNOSIS — Z808 Family history of malignant neoplasm of other organs or systems: Secondary | ICD-10-CM | POA: Insufficient documentation

## 2023-02-10 NOTE — Progress Notes (Signed)
Patient states that since her hospital stay which she was discharged on 02/07/2023, she has been feeling so much better, her appetite is back and she has energy again. She also mentioned how when she had the MRI with contrast, she started having a shaking fit that lasted for about 3 days.

## 2023-02-10 NOTE — Progress Notes (Signed)
[facility]  Telephone:([phone]) [phone] [phone]  ID: Bridget Howe OB: [birth year]  MR#: [id number]  AOZ#:[id number]  Patient Care Team: Lauro Regulus, MD as PCP - General (Internal Medicine) Michaelyn Barter, MD as Consulting Physician (Oncology)  REFERRING PROVIDER: Dr. Nelson Chimes  REASON FOR REFERRAL: Hypercalcemia  HPI: Bridget Howe is a 77 y.o. female with past medical history of CAD, hypothyroidism, OSA, prediabetes, right knee replacement in August 2024 was referred to hematology for hypercalcemia and multiple myeloma workup.  Admitted from 01/28/23 to 02/04/2023 -with symptoms of nausea and dizziness.  Found to have calcium of 14.4.  Creatinine 1.57.  Received zoledronic acid, IV fluid and calcitonin.  Significantly elevated vitamin D 128.  PTH low.  Normal phosphorus. CT chest abdomen pelvis no acute process.  No concern for malignancy.  SPEP/IFE no M protein.  24-hour urine total protein 280 mg.  No M protein.  Kappa mildly elevated to 47, lambda 25.6 with a ratio of 1.84.  ACE level mildly elevated to 105.  Has history of right knee replacement in August 2024 complicated by infection.  First treated with Keflex and then 2 rounds of Bactrim. Repeat labs from yesterday shows normal hemoglobin level, normal calcium and creatinine. Patient has stopped all the multivitamins and supplements. Since the hospital discharge, she is gradually feeling back to her baseline.  Her appetite has improved.  Last mammogram was in June 2024 which was negative.  REVIEW OF SYSTEMS:   ROS  As per HPI. Otherwise, a complete review of systems is negative.  PAST MEDICAL HISTORY: Past Medical History:  Diagnosis Date   Actinic keratosis    Arthritis    Asthma    Complication of anesthesia    a.) egg white/yolk allergy - unable to tolerate propofol   Complication of anesthesia    limited ROM cervical   Coronary artery disease    COVID-19     Dysrhythmia    Hemorrhoids    Hypoglycemia    Hypothyroidism    Myocardial infarction (HCC)    anterior wall MI at 77 yr old   OSA treated with BiPAP    Prediabetes 10/08/2014   Primary osteoarthritis of right knee    Squamous cell carcinoma of skin 09/05/2007   Right dorsal hand   Squamous cell carcinoma of skin    L medial canthus, treated around 1992   Stroke [facility])    scan shows possible mini strokes    PAST SURGICAL HISTORY: Past Surgical History:  Procedure Laterality Date   BREAST CYST ASPIRATION Bilateral    3 on right, 2 on the left   CARDIAC CATHETERIZATION     CARPAL TUNNEL RELEASE Bilateral    CATARACT EXTRACTION W/PHACO Left 05/10/2022   Procedure: CATARACT EXTRACTION PHACO AND INTRAOCULAR LENS PLACEMENT (IOC) LEFT  CLAREON VIVITY LENS  7.00  00:38.9;  Surgeon: Galen Manila, MD;  Location: [facility];  Service: Ophthalmology;  Laterality: Left;   CATARACT EXTRACTION W/PHACO Right 05/24/2022   Procedure: CATARACT EXTRACTION PHACO AND INTRAOCULAR LENS PLACEMENT (IOC) RIGHT CLAREON VIVITY 6.22 00:40.2;  Surgeon: Galen Manila, MD;  Location: [facility];  Service: Ophthalmology;  Laterality: Right;   COLONOSCOPY WITH PROPOFOL N/A 03/09/2016   Procedure: COLONOSCOPY WITH PROPOFOL;  Surgeon: Scot Jun, MD;  Location: [facility] ENDOSCOPY;  Service: Endoscopy;  Laterality: N/A;   ESOPHAGOGASTRODUODENOSCOPY (EGD) WITH PROPOFOL N/A 03/09/2016   Procedure: ESOPHAGOGASTRODUODENOSCOPY (EGD) WITH PROPOFOL;  Surgeon: Scot Jun, MD;  Location: [facility] ENDOSCOPY;  Service:  Endoscopy;  Laterality: N/A;   KNEE ARTHROPLASTY Right 12/14/2022   Procedure: COMPUTER ASSISTED TOTAL KNEE ARTHROPLASTY;  Surgeon: Donato Heinz, MD;  Location: [facility] ORS;  Service: Orthopedics;  Laterality: Right;   KNEE ARTHROSCOPY Right    MENISCUS REPAIR Right    NECK SURGERY     TENDON REPAIR Left    wrist   TONSILLECTOMY     TRIGGER FINGER RELEASE Right     FAMILY  HISTORY: Family History  Problem Relation Age of Onset   Breast cancer Sister 42   Skin cancer Sister    Diabetes Mellitus II Sister    Hypertension Sister    Melanoma Sister    Hypertension Mother    Heart failure Mother    Skin cancer Father    Melanoma Father    Autoimmune disease Daughter     HEALTH MAINTENANCE: Social History   Tobacco Use   Smoking status: Former   Smokeless tobacco: Never  Substance Use Topics   Alcohol use: No   Drug use: No     Allergies  Allergen Reactions   Hydralazine Hcl Other (See Comments)    Headache, nausea, blurred vision   Influenza Vaccines Anaphylaxis   Caffeine    Copper-Containing Compounds    Covid-19 (Subunit) Vaccine Other (See Comments)    Chest pain   Egg-Derived Products Swelling   Gabapentin Other (See Comments)   Nexium [Esomeprazole] Cough    Joint pain   Nickel    Propofol Other (See Comments)    Patient prefers to avoid due to egg allergy   Pseudoephedrine Hcl    Ranitidine Hcl Cough    Joint pain   Tacrolimus     Current Outpatient Medications  Medication Sig Dispense Refill   acetaminophen (TYLENOL) 500 MG tablet Take 500 mg by mouth every 4 (four) hours as needed for mild pain.     amLODipine (NORVASC) 10 MG tablet Take 1 tablet (10 mg total) by mouth daily. 30 tablet 1   aspirin EC 81 MG tablet Take 1 tablet (81 mg total) by mouth in the morning and at bedtime.     b complex vitamins capsule Take 1 capsule by mouth daily.     feeding supplement, GLUCERNA SHAKE, (GLUCERNA SHAKE) LIQD Take 237 mLs by mouth 3 (three) times daily between meals. 10000 mL 11   Fiber Adult Gummies 2 g CHEW Chew 2 tablets by mouth at bedtime.     fluticasone (FLONASE) 50 MCG/ACT nasal spray Place 2 sprays into both nostrils daily.     Krill Oil 500 MG CAPS Take 1 capsule by mouth daily.     levothyroxine (SYNTHROID, LEVOTHROID) 50 MCG tablet Take 50 mcg by mouth daily before breakfast.     Misc Natural Products (OSTEO BI-FLEX  ADV DOUBLE ST PO) Take 1 tablet by mouth 2 (two) times daily.     ondansetron (ZOFRAN) 4 MG tablet Take 1 tablet (4 mg total) by mouth every 6 (six) hours as needed for nausea. 20 tablet 0   traMADol (ULTRAM) 50 MG tablet Take 1-2 tablets (50-100 mg total) by mouth every 4 (four) hours as needed for moderate pain. (Patient not taking: Reported on 01/28/2023) 30 tablet 0   No current facility-administered medications for this visit.    OBJECTIVE: Vitals:   02/10/23 1134  BP: 134/70  Pulse: 76  Temp: 97.8 F (36.6 C)  SpO2: 100%     Body mass index is 27.8 kg/m.      General:  Well-developed, well-nourished, no acute distress. Eyes: Pink conjunctiva, anicteric sclera. HEENT: Normocephalic, moist mucous membranes, clear oropharnyx. Lungs: Clear to auscultation bilaterally. Heart: Regular rate and rhythm. No rubs, murmurs, or gallops. Abdomen: Soft, nontender, nondistended. No organomegaly noted, normoactive bowel sounds. Musculoskeletal: No edema, cyanosis, or clubbing. Neuro: Alert, answering all questions appropriately. Cranial nerves grossly intact. Skin: No rashes or petechiae noted. Psych: Normal affect. Lymphatics: No cervical, calvicular, axillary or inguinal LAD.   LAB RESULTS:  Lab Results  Component Value Date   NA 137 02/04/2023   K 3.2 (L) 02/04/2023   CL 105 02/04/2023   CO2 26 02/04/2023   GLUCOSE 110 (H) 02/04/2023   BUN 16 02/04/2023   CREATININE 0.83 02/04/2023   CALCIUM 9.9 02/04/2023   PROT 6.6 02/04/2023   ALBUMIN 3.1 (L) 02/04/2023   AST 31 02/04/2023   ALT 63 (H) 02/04/2023   ALKPHOS 74 02/04/2023   BILITOT 0.7 02/04/2023   GFRNONAA >60 02/04/2023    Lab Results  Component Value Date   WBC 7.2 02/02/2023   HGB 12.8 02/02/2023   HCT 37.9 02/02/2023   MCV 94.3 02/02/2023   PLT 365 02/02/2023    No results found for: "TIBC", "FERRITIN", "IRONPCTSAT"   STUDIES: CT CHEST ABDOMEN PELVIS W CONTRAST  Result Date: 02/02/2023 CLINICAL DATA:   Post knee replacement complicated by wound infection elevated blood pressure weight loss up to 10 pounds EXAM: CT CHEST, ABDOMEN, AND PELVIS WITH CONTRAST TECHNIQUE: Multidetector CT imaging of the chest, abdomen and pelvis was performed following the standard protocol during bolus administration of intravenous contrast. RADIATION DOSE REDUCTION: This exam was performed according to the departmental dose-optimization program which includes automated exposure control, adjustment of the mA and/or kV according to patient size and/or use of iterative reconstruction technique. CONTRAST:  OMNIPAQUE IOHEXOL 300 MG/ML  SOLN COMPARISON:  Ultrasound 01/31/2023, CT 07/17/2017 FINDINGS: CT CHEST FINDINGS Cardiovascular: Nonaneurysmal aorta. Normal cardiac size. No pericardial effusion Mediastinum/Nodes: No enlarged mediastinal, hilar, or axillary lymph nodes. Thyroid gland, trachea, and esophagus demonstrate no significant findings. Lungs/Pleura: Lungs are clear. No pleural effusion or pneumothorax. Musculoskeletal: No chest wall mass or suspicious bone lesions identified. CT ABDOMEN PELVIS FINDINGS Hepatobiliary: Hepatic steatosis. No calcified gallstone or biliary dilatation Pancreas: No inflammation.  Stable prominent pancreatic duct. Spleen: Normal in size without focal abnormality. Adrenals/Urinary Tract: Adrenal glands are unremarkable. Kidneys are normal, without renal calculi, focal lesion, or hydronephrosis. Bladder is unremarkable. Stomach/Bowel: Stomach is within normal limits. Appendix appears normal. No evidence of bowel wall thickening, distention, or inflammatory changes. Vascular/Lymphatic: No significant vascular findings are present. No enlarged abdominal or pelvic lymph nodes. Mild atherosclerosis Reproductive: Uterus and bilateral adnexa are unremarkable. Other: No abdominal wall hernia or abnormality. No abdominopelvic ascites. Musculoskeletal: No acute or significant osseous findings. IMPRESSION: 1.  No CT evidence for acute intrathoracic, intra-abdominal, or intrapelvic abnormality. 2. Hepatic steatosis. Electronically Signed   By: Jasmine Pang M.D.   On: 02/02/2023 18:19   US Abdomen Limited RUQ (LIVER/GB)  Result Date: 01/31/2023 CLINICAL DATA:  Transaminitis. EXAM: ULTRASOUND ABDOMEN LIMITED RIGHT UPPER QUADRANT COMPARISON:  June 29, 2022. FINDINGS: Gallbladder: No gallstones or wall thickening visualized. No sonographic Murphy sign noted by sonographer. Common bile duct: Diameter: 3 mm which is within normal limits. Liver: No focal lesion identified. Within normal limits in parenchymal echogenicity. Portal vein is patent on color Doppler imaging with normal direction of blood flow towards the liver. Other: None. IMPRESSION: No definite abnormality seen in the right upper quadrant  of the abdomen. Electronically Signed   By: Lupita Raider M.D.   On: 01/31/2023 12:58   MR BRAIN WO CONTRAST  Result Date: 01/28/2023 CLINICAL DATA:  Dizziness over the last 2 days which is constant. EXAM: MRI HEAD WITHOUT CONTRAST TECHNIQUE: Multiplanar, multiecho pulse sequences of the brain and surrounding structures were obtained without intravenous contrast. COMPARISON:  10/10/2014 FINDINGS: Brain: Diffusion imaging does not show any acute or subacute infarction. There are mild chronic small-vessel ischemic changes of the pons. 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 Cerebral hemispheres show minimal chronic small-vessel change of the white matter. No cortical or large vessel territory infarction. No mass lesion, hemorrhage, hydrocephalus or extra-axial collection. Vascular: Major vessels at the base of the brain show flow. Skull and upper cervical spine: Negative Sinuses/Orbits: Clear/normal Other: No fluid in the middle ears or mastoid air cells. IMPRESSION: No acute or reversible finding. Mild chronic small-vessel ischemic changes of the pons and cerebellum. Minimal chronic  small-vessel change of the cerebral hemispheric white matter. Electronically Signed   By: Paulina Fusi M.D.   On: 01/28/2023 21:01    ASSESSMENT AND PLAN:   Bridget Howe is a 77 y.o. female with pmh of CAD, hypothyroidism, OSA, prediabetes, right knee replacement in August 2024 was referred to hematology for hypercalcemia and multiple myeloma workup.  # Hypercalcemia # Elevated kappa light chain - Admitted from 01/28/23 to 02/04/2023 -with calcium of 14.4.  Creatinine 1.57.  Received zoledronic acid, IV fluid and calcitonin.  Significantly elevated vitamin D 128.  PTH low.  Normal phosphorus. CT chest abdomen pelvis no acute process.  No concern for malignancy.  SPEP/IFE no M protein.  24-hour urine total protein 280 mg.  No M protein.  Kappa mildly elevated to 47, lambda 25.6 with a ratio of 1.84.  ACE mildly elevated to 105.  -I discussed with the patient that lab does not show monoclonal protein.  There is mildly elevated kappa level which may happen in setting of AKI.  No clear-cut evidence of plasma cell dyscrasia.  I offered bone scan to rule out any bone lesions which patient would like to hold off at this time.  She had reaction with tremors after IV contrast with CT scan.  She does not want to go through any scans at this time.  Etiology for hypercalcemia remains unclear.  I will repeat her myeloma panel in 3 months time to assess for stability in the kappa chain.  If stable, patient can be discharged and will continue to follow with Dr. Dareen Piano her primary care.  Labs from yesterday show normal hemoglobin, calcium and creatinine levels.  -Patient's referral to rheumatology for mildly elevated ACE level is managed by primary.   Orders Placed This Encounter  Procedures   CBC with Differential (Cancer Center Only)   Basic Metabolic Panel - Cancer Center Only   Multiple Myeloma Panel (SPEP&IFE w/QIG)   Kappa/lambda light chains   RTC in 3 months for MD visit, labs 10 days  prior.  Patient expressed understanding and was in agreement with this plan. She also understands that She can call clinic at any time with any questions, concerns, or complaints.   I spent a total of 45 minutes reviewing chart data, face-to-face evaluation with the patient, counseling and coordination of care as detailed above.  Michaelyn Barter, MD   02/10/2023 11:40 AM

## 2023-02-11 LAB — PTH-RELATED PEPTIDE: PTH-related peptide: <2 pmol/L

## 2023-02-17 DIAGNOSIS — Z96651 Presence of right artificial knee joint: Secondary | ICD-10-CM | POA: Diagnosis not present

## 2023-04-04 DIAGNOSIS — M1711 Unilateral primary osteoarthritis, right knee: Secondary | ICD-10-CM | POA: Diagnosis not present

## 2023-04-04 DIAGNOSIS — Z96651 Presence of right artificial knee joint: Secondary | ICD-10-CM | POA: Diagnosis not present

## 2023-04-04 DIAGNOSIS — I10 Essential (primary) hypertension: Secondary | ICD-10-CM | POA: Diagnosis not present

## 2023-05-04 ENCOUNTER — Inpatient Hospital Stay: Payer: PPO | Attending: Internal Medicine

## 2023-05-04 DIAGNOSIS — R768 Other specified abnormal immunological findings in serum: Secondary | ICD-10-CM | POA: Diagnosis not present

## 2023-05-04 DIAGNOSIS — Z79899 Other long term (current) drug therapy: Secondary | ICD-10-CM | POA: Insufficient documentation

## 2023-05-04 DIAGNOSIS — Z96651 Presence of right artificial knee joint: Secondary | ICD-10-CM | POA: Insufficient documentation

## 2023-05-04 LAB — CBC WITH DIFFERENTIAL (CANCER CENTER ONLY)
Abs Immature Granulocytes: 0.01 10*3/uL (ref 0.00–0.07)
Basophils Absolute: 0.1 10*3/uL (ref 0.0–0.1)
Basophils Relative: 1 %
Eosinophils Absolute: 0.3 10*3/uL (ref 0.0–0.5)
Eosinophils Relative: 6 %
HCT: 44.7 % (ref 36.0–46.0)
Hemoglobin: 14.8 g/dL (ref 12.0–15.0)
Immature Granulocytes: 0 %
Lymphocytes Relative: 32 %
Lymphs Abs: 1.5 10*3/uL (ref 0.7–4.0)
MCH: 32.4 pg (ref 26.0–34.0)
MCHC: 33.1 g/dL (ref 30.0–36.0)
MCV: 97.8 fL (ref 80.0–100.0)
Monocytes Absolute: 0.6 10*3/uL (ref 0.1–1.0)
Monocytes Relative: 12 %
Neutro Abs: 2.3 10*3/uL (ref 1.7–7.7)
Neutrophils Relative %: 49 %
Platelet Count: 269 10*3/uL (ref 150–400)
RBC: 4.57 MIL/uL (ref 3.87–5.11)
RDW: 13.6 % (ref 11.5–15.5)
WBC Count: 4.7 10*3/uL (ref 4.0–10.5)
nRBC: 0 % (ref 0.0–0.2)

## 2023-05-04 LAB — BASIC METABOLIC PANEL - CANCER CENTER ONLY
Anion gap: 9 (ref 5–15)
BUN: 18 mg/dL (ref 8–23)
CO2: 27 mmol/L (ref 22–32)
Calcium: 9.2 mg/dL (ref 8.9–10.3)
Chloride: 102 mmol/L (ref 98–111)
Creatinine: 0.94 mg/dL (ref 0.44–1.00)
GFR, Estimated: >60 mL/min (ref >60)
Glucose, Bld: 97 mg/dL (ref 70–99)
Potassium: 4.5 mmol/L (ref 3.5–5.1)
Sodium: 138 mmol/L (ref 135–145)

## 2023-05-05 LAB — KAPPA/LAMBDA LIGHT CHAINS
Kappa free light chain: 29.5 mg/L — ABNORMAL HIGH (ref 3.3–19.4)
Kappa, lambda light chain ratio: 1.71 — ABNORMAL HIGH (ref 0.26–1.65)
Lambda free light chains: 17.3 mg/L (ref 5.7–26.3)

## 2023-05-12 LAB — MULTIPLE MYELOMA PANEL, SERUM
Albumin SerPl Elph-Mcnc: 4 g/dL (ref 2.9–4.4)
Albumin/Glob SerPl: 1.4 (ref 0.7–1.7)
Alpha 1: 0.2 g/dL (ref 0.0–0.4)
Alpha2 Glob SerPl Elph-Mcnc: 0.7 g/dL (ref 0.4–1.0)
B-Globulin SerPl Elph-Mcnc: 0.9 g/dL (ref 0.7–1.3)
Gamma Glob SerPl Elph-Mcnc: 1.2 g/dL (ref 0.4–1.8)
Globulin, Total: 3 g/dL (ref 2.2–3.9)
IgA: 148 mg/dL (ref 64–422)
IgG (Immunoglobin G), Serum: 1283 mg/dL (ref 586–1602)
IgM (Immunoglobulin M), Srm: 87 mg/dL (ref 26–217)
Total Protein ELP: 7 g/dL (ref 6.0–8.5)

## 2023-05-15 ENCOUNTER — Encounter: Payer: Self-pay | Admitting: Internal Medicine

## 2023-05-15 ENCOUNTER — Inpatient Hospital Stay: Payer: PPO | Admitting: Internal Medicine

## 2023-05-15 ENCOUNTER — Other Ambulatory Visit: Payer: PPO

## 2023-05-15 DIAGNOSIS — R768 Other specified abnormal immunological findings in serum: Secondary | ICD-10-CM | POA: Diagnosis not present

## 2023-05-15 NOTE — Progress Notes (Signed)
 Patient is doing well, no new questions or concerns for the doctor today.

## 2023-05-15 NOTE — Progress Notes (Signed)
 [name]  [phone] [phone]  ID: [name] OB: 12-19-1945  MR#: [id number]  RDW#:[id number]  Patient Care Team: Lenon Layman ORN, MD as PCP - General (Internal Medicine) Clista Bimler, MD as Consulting Physician (Oncology)  REASON FOR REFERRAL: Hypercalcemia  HPI: Bridget Howe is a 78 y.o. female with past medical history of CAD, hypothyroidism, OSA, prediabetes, right Howe replacement in August 2024 was referred to hematology for hypercalcemia and multiple myeloma workup.  Admitted from 01/28/23 to 02/04/2023 -with symptoms of nausea and dizziness.  Found to have calcium of 14.4.  Creatinine 1.57.  Received zoledronic  acid, IV fluid and calcitonin.  Significantly elevated vitamin D  128.  PTH low.  Normal phosphorus. CT chest abdomen pelvis no acute process.  No concern for malignancy.  SPEP/IFE no M protein.  24-hour urine total protein 280 mg.  No M protein.  Kappa mildly elevated to 47, lambda 25.6 with a ratio of 1.84.  ACE level mildly elevated to 105.  Has history of right Howe replacement in August 2024 complicated by infection.  First treated with Keflex and then 2 rounds of Bactrim. Patient has stopped all the multivitamins and supplements. Last mammogram was in June 2024 which was negative.  Interval history Patient was seen today as 80-month follow-up for labs She has been feeling well.  Denies any new concerns.  Reports she has issues with esophageal functioning and relies mainly on soft food such as yogurt, milk, cheese.  Tells me rice gets stuck.  She has stopped all the milk products since the last hospitalization and wanting to know if she can start introducing.  REVIEW OF SYSTEMS:   ROS  As per HPI. Otherwise, a complete review of systems is negative.  PAST MEDICAL HISTORY: Past Medical History:  Diagnosis Date   Actinic keratosis    Arthritis    Asthma    Complication of anesthesia    a.)  egg white/yolk allergy - unable to tolerate propofol    Complication of anesthesia    limited ROM cervical   Coronary artery disease    COVID-19    Dysrhythmia    Hemorrhoids    Hypoglycemia    Hypothyroidism    Myocardial infarction (HCC)    anterior wall MI at 78 yr old   OSA treated with BiPAP    Prediabetes 10/08/2014   Primary osteoarthritis of right Howe    Squamous cell carcinoma of skin 09/05/2007   Right dorsal hand   Squamous cell carcinoma of skin    L medial canthus, treated around 1992   Stroke [facility])    scan shows possible mini strokes    PAST SURGICAL HISTORY: Past Surgical History:  Procedure Laterality Date   BREAST CYST ASPIRATION Bilateral    3 on right, 2 on the left   CARDIAC CATHETERIZATION     CARPAL TUNNEL RELEASE Bilateral    CATARACT EXTRACTION W/PHACO Left 05/10/2022   Procedure: CATARACT EXTRACTION PHACO AND INTRAOCULAR LENS PLACEMENT (IOC) LEFT  CLAREON VIVITY LENS  7.00  00:38.9;  Surgeon: Jaye Fallow, MD;  Location: [facility];  Service: Ophthalmology;  Laterality: Left;   CATARACT EXTRACTION W/PHACO Right 05/24/2022   Procedure: CATARACT EXTRACTION PHACO AND INTRAOCULAR LENS PLACEMENT (IOC) RIGHT CLAREON VIVITY 6.22 00:40.2;  Surgeon: Jaye Fallow, MD;  Location: [facility];  Service: Ophthalmology;  Laterality: Right;   COLONOSCOPY WITH PROPOFOL  N/A 03/09/2016   Procedure: COLONOSCOPY WITH PROPOFOL ;  Surgeon: Lamar ONEIDA Holmes, MD;  Location: [facility] ENDOSCOPY;  Service: Endoscopy;  Laterality: N/A;   ESOPHAGOGASTRODUODENOSCOPY (EGD) WITH PROPOFOL  N/A 03/09/2016   Procedure: ESOPHAGOGASTRODUODENOSCOPY (EGD) WITH PROPOFOL ;  Surgeon: Lamar ONEIDA Holmes, MD;  Location: [facility] ENDOSCOPY;  Service: Endoscopy;  Laterality: N/A;   Howe ARTHROPLASTY Right 12/14/2022   Procedure: COMPUTER ASSISTED TOTAL Howe ARTHROPLASTY;  Surgeon: Mardee Lynwood SQUIBB, MD;  Location: [facility] ORS;  Service: Orthopedics;  Laterality: Right;   Howe ARTHROSCOPY  Right    MENISCUS REPAIR Right    NECK SURGERY     TENDON REPAIR Left    wrist   TONSILLECTOMY     TRIGGER FINGER RELEASE Right     FAMILY HISTORY: Family History  Problem Relation Age of Onset   Breast cancer Sister 65   Skin cancer Sister    Diabetes Mellitus II Sister    Hypertension Sister    Melanoma Sister    Hypertension Mother    Heart failure Mother    Skin cancer Father    Melanoma Father    Autoimmune disease Daughter     HEALTH MAINTENANCE: Social History   Tobacco Use   Smoking status: Former   Smokeless tobacco: Never  Substance Use Topics   Alcohol use: No   Drug use: No     Allergies  Allergen Reactions   Hydralazine  Hcl Other (See Comments)    Headache, nausea, blurred vision   Influenza Vaccines Anaphylaxis   Caffeine    Copper-Containing Compounds    Covid-19 (Subunit) Vaccine Other (See Comments)    Chest pain   Egg-Derived Products Swelling   Gabapentin  Other (See Comments)   Nexium [Esomeprazole] Cough    Joint pain   Nickel    Propofol  Other (See Comments)    Patient prefers to avoid due to egg allergy   Pseudoephedrine Hcl    Ranitidine Hcl Cough    Joint pain   Tacrolimus     Current Outpatient Medications  Medication Sig Dispense Refill   amLODipine  (NORVASC ) 10 MG tablet Take 1 tablet (10 mg total) by mouth daily. 30 tablet 1   aspirin  EC 81 MG tablet Take 1 tablet (81 mg total) by mouth in the morning and at bedtime.     b complex vitamins capsule Take 1 capsule by mouth daily.     Fiber Adult Gummies 2 g CHEW Chew 2 tablets by mouth at bedtime.     fluticasone  (FLONASE ) 50 MCG/ACT nasal spray Place 2 sprays into both nostrils daily.     levothyroxine  (SYNTHROID , LEVOTHROID) 50 MCG tablet Take 50 mcg by mouth daily before breakfast.     Misc Natural Products (OSTEO BI-FLEX ADV DOUBLE ST PO) Take 1 tablet by mouth 2 (two) times daily.     acetaminophen  (TYLENOL ) 500 MG tablet Take 500 mg by mouth every 4 (four) hours as  needed for mild pain. (Patient not taking: Reported on 05/15/2023)     feeding supplement, GLUCERNA SHAKE, (GLUCERNA SHAKE) LIQD Take 237 mLs by mouth 3 (three) times daily between meals. (Patient not taking: Reported on 05/15/2023) 10000 mL 11   Krill Oil 500 MG CAPS Take 1 capsule by mouth daily. (Patient not taking: Reported on 05/15/2023)     ondansetron  (ZOFRAN ) 4 MG tablet Take 1 tablet (4 mg total) by mouth every 6 (six) hours as needed for nausea. (Patient not taking: Reported on 05/15/2023) 20 tablet 0   traMADol  (ULTRAM ) 50 MG tablet Take 1-2 tablets (50-100 mg total) by mouth every 4 (four) hours as needed for moderate pain. (Patient not  taking: Reported on 01/28/2023) 30 tablet 0   No current facility-administered medications for this visit.    OBJECTIVE: Vitals:   05/15/23 1036  BP: (!) 140/76  Pulse: 62  Resp: 16  Temp: (!) 97 F (36.1 C)  SpO2: 100%     Body mass index is 28.35 kg/m.      General: Well-developed, well-nourished, no acute distress. Eyes: Pink conjunctiva, anicteric sclera. HEENT: Normocephalic, moist mucous membranes, clear oropharnyx. Lungs: Clear to auscultation bilaterally. Heart: Regular rate and rhythm. No rubs, murmurs, or gallops. Abdomen: Soft, nontender, nondistended. No organomegaly noted, normoactive bowel sounds. Musculoskeletal: No edema, cyanosis, or clubbing. Neuro: Alert, answering all questions appropriately. Cranial nerves grossly intact. Skin: No rashes or petechiae noted. Psych: Normal affect. Lymphatics: No cervical, calvicular, axillary or inguinal LAD.   LAB RESULTS:  Lab Results  Component Value Date   NA 138 05/04/2023   K 4.5 05/04/2023   CL 102 05/04/2023   CO2 27 05/04/2023   GLUCOSE 97 05/04/2023   BUN 18 05/04/2023   CREATININE 0.94 05/04/2023   CALCIUM 9.2 05/04/2023   PROT 6.6 02/04/2023   ALBUMIN 3.1 (L) 02/04/2023   AST 31 02/04/2023   ALT 63 (H) 02/04/2023   ALKPHOS 74 02/04/2023   BILITOT 0.7 02/04/2023    GFRNONAA >60 05/04/2023    Lab Results  Component Value Date   WBC 4.7 05/04/2023   NEUTROABS 2.3 05/04/2023   HGB 14.8 05/04/2023   HCT 44.7 05/04/2023   MCV 97.8 05/04/2023   PLT 269 05/04/2023    No results found for: TIBC, FERRITIN, IRONPCTSAT   STUDIES: No results found.  ASSESSMENT AND PLAN:   CHRYSTINE FROGGE is a 78 y.o. female with pmh of CAD, hypothyroidism, OSA, prediabetes, right Howe replacement in August 2024 was referred to hematology for hypercalcemia and multiple myeloma workup.  # Hypercalcemia # Elevated kappa light chain - Admitted from 01/28/23 to 02/04/2023 -with calcium of 14.4.  Creatinine 1.57.  Received zoledronic  acid, IV fluid and calcitonin.  Significantly elevated vitamin D  128.  PTH low.  Normal phosphorus. CT chest abdomen pelvis no acute process.  No concern for malignancy.  SPEP/IFE no M protein.  24-hour urine total protein 280 mg.  No M protein.  Kappa mildly elevated to 47, lambda 25.6 with a ratio of 1.84.  ACE mildly elevated to 105.  - Labs repeated on 05/04/2023.  CBC and BMP is unremarkable.  Calcium is normal at 9.2.  SPEP/IFE no M protein.  Kappa free light chain improved from 47-29.5.  Lambda 17.3 with a ratio of 1.7 (normal ranges 0.26-1.65).  Previously urine immunofixation has been negative for monoclonal protein. I discussed with the patient in detail about the lab results and that there is no evidence of monoclonal process.  Kappa light chains are looking improved.  I do not see any evidence of multiple myeloma.  Patient has previously declined bone scan due to concerns for allergy to IV contrast.  She will follow-up with me as needed.  Continue to follow with Dr. Lenon her PCP.  -Patient reports issues with her esophagus and that she cannot eat solid foods such as rice.  She relies on soft foods such as yogurt, cheese.  She would like to know if she can introduce it back.  Advised to refrain from calcium vitamin D   supplementation.  Calcium is normal at 9.2.  She can just do a slow introduction of these food.  She is scheduled for repeat lab check  with Dr. Lenon next month.   RTC as needed.  Patient expressed understanding and was in agreement with this plan. She also understands that She can call clinic at any time with any questions, concerns, or complaints.   I spent a total of 30 minutes reviewing chart data, face-to-face evaluation with the patient, counseling and coordination of care as detailed above.  Genevia Rous, MD   05/15/2023 11:49 AM

## 2023-06-01 LAB — UPEP/TP, 24-HR URINE
Albumin, U: 50.8 %
Alpha 1, Urine: 5.3 %
Alpha 2, Urine: 11.4 %
Beta, Urine: 18.3 %
Gamma Globulin, Urine: 14.1 %
Total Protein, Urine-Ur/day: 280 mg/(24.h) — ABNORMAL HIGH (ref 30–150)
Total Protein, Urine: 9.3 mg/dL

## 2023-06-16 DIAGNOSIS — R7303 Prediabetes: Secondary | ICD-10-CM | POA: Diagnosis not present

## 2023-06-16 DIAGNOSIS — E039 Hypothyroidism, unspecified: Secondary | ICD-10-CM | POA: Diagnosis not present

## 2023-06-23 DIAGNOSIS — E039 Hypothyroidism, unspecified: Secondary | ICD-10-CM | POA: Diagnosis not present

## 2023-06-23 DIAGNOSIS — G4733 Obstructive sleep apnea (adult) (pediatric): Secondary | ICD-10-CM | POA: Diagnosis not present

## 2023-06-23 DIAGNOSIS — R7303 Prediabetes: Secondary | ICD-10-CM | POA: Diagnosis not present

## 2023-06-23 DIAGNOSIS — I779 Disorder of arteries and arterioles, unspecified: Secondary | ICD-10-CM | POA: Diagnosis not present

## 2023-06-23 DIAGNOSIS — Z0000 Encounter for general adult medical examination without abnormal findings: Secondary | ICD-10-CM | POA: Diagnosis not present

## 2023-07-25 DIAGNOSIS — Z96651 Presence of right artificial knee joint: Secondary | ICD-10-CM | POA: Diagnosis not present

## 2023-09-04 ENCOUNTER — Other Ambulatory Visit: Payer: Self-pay | Admitting: Internal Medicine

## 2023-09-04 DIAGNOSIS — Z1231 Encounter for screening mammogram for malignant neoplasm of breast: Secondary | ICD-10-CM

## 2023-10-11 ENCOUNTER — Ambulatory Visit
Admission: RE | Admit: 2023-10-11 | Discharge: 2023-10-11 | Disposition: A | Discharge status: Home / Self Care | Source: Ambulatory Visit | Attending: Internal Medicine | Admitting: Internal Medicine

## 2023-10-11 DIAGNOSIS — Z1231 Encounter for screening mammogram for malignant neoplasm of breast: Secondary | ICD-10-CM | POA: Diagnosis not present

## 2023-11-09 DIAGNOSIS — H26491 Other secondary cataract, right eye: Secondary | ICD-10-CM | POA: Diagnosis not present

## 2023-11-09 DIAGNOSIS — H43813 Vitreous degeneration, bilateral: Secondary | ICD-10-CM | POA: Diagnosis not present

## 2023-12-11 DIAGNOSIS — R7303 Prediabetes: Secondary | ICD-10-CM | POA: Diagnosis not present

## 2023-12-11 DIAGNOSIS — I779 Disorder of arteries and arterioles, unspecified: Secondary | ICD-10-CM | POA: Diagnosis not present

## 2023-12-11 DIAGNOSIS — E039 Hypothyroidism, unspecified: Secondary | ICD-10-CM | POA: Diagnosis not present

## 2023-12-14 DIAGNOSIS — Z96651 Presence of right artificial knee joint: Secondary | ICD-10-CM | POA: Diagnosis not present

## 2023-12-14 NOTE — Progress Notes (Signed)
 Review of Systems   Constitutional: Negative.    HENT: Negative.     Eyes: Negative.    Respiratory: Negative.     Cardiovascular: Negative.    Gastrointestinal: Negative.    Endocrine: Negative.    Genitourinary: Negative.    Musculoskeletal:  Positive for neck pain.   Skin: Negative.    Allergic/Immunologic: Negative.    Neurological: Negative.    Hematological: Negative.    Psychiatric/Behavioral: Negative.

## 2024-01-23 ENCOUNTER — Ambulatory Visit: Payer: PPO | Admitting: Dermatology

## 2024-01-23 DIAGNOSIS — L578 Other skin changes due to chronic exposure to nonionizing radiation: Secondary | ICD-10-CM

## 2024-01-23 DIAGNOSIS — L988 Other specified disorders of the skin and subcutaneous tissue: Secondary | ICD-10-CM

## 2024-01-23 DIAGNOSIS — L821 Other seborrheic keratosis: Secondary | ICD-10-CM | POA: Diagnosis not present

## 2024-01-23 DIAGNOSIS — Z1283 Encounter for screening for malignant neoplasm of skin: Secondary | ICD-10-CM

## 2024-01-23 DIAGNOSIS — Z85828 Personal history of other malignant neoplasm of skin: Secondary | ICD-10-CM | POA: Diagnosis not present

## 2024-01-23 DIAGNOSIS — D1801 Hemangioma of skin and subcutaneous tissue: Secondary | ICD-10-CM

## 2024-01-23 DIAGNOSIS — Z7189 Other specified counseling: Secondary | ICD-10-CM | POA: Diagnosis not present

## 2024-01-23 DIAGNOSIS — D229 Melanocytic nevi, unspecified: Secondary | ICD-10-CM

## 2024-01-23 DIAGNOSIS — L814 Other melanin hyperpigmentation: Secondary | ICD-10-CM

## 2024-01-23 DIAGNOSIS — L570 Actinic keratosis: Secondary | ICD-10-CM

## 2024-01-23 DIAGNOSIS — W908XXA Exposure to other nonionizing radiation, initial encounter: Secondary | ICD-10-CM | POA: Diagnosis not present

## 2024-01-23 NOTE — Patient Instructions (Signed)

## 2024-01-23 NOTE — Progress Notes (Signed)
 Follow-Up Visit   Subjective  Bridget Howe is a 78 y.o. female who presents for the following: Skin Cancer Screening and Full Body Skin Exam  The patient presents for Total-Body Skin Exam (TBSE) for skin cancer screening and mole check. The patient has spots, moles and lesions to be evaluated, some may be new or changing. She has several scaly spots on the face. History of AKs.     The following portions of the chart were reviewed this encounter and updated as appropriate: medications, allergies, medical history  Review of Systems:  No other skin or systemic complaints except as noted in HPI or Assessment and Plan.  Objective  Well appearing patient in no apparent distress; mood and affect are within normal limits.  A full examination was performed including scalp, head, eyes, ears, nose, lips, neck, chest, axillae, abdomen, back, buttocks, bilateral upper extremities, bilateral lower extremities, hands, feet, fingers, toes, fingernails, and toenails. All findings within normal limits unless otherwise noted below.   Relevant physical exam findings are noted in the Assessment and Plan.  L upper lip x1, L chin x 1, R mid cheek x 1, L cheek x 1, L nasal tip x 1, L upper eyebrow x 1, L upper eyelid x 1, chest x 2 (9) Pink scaly macules.  Assessment & Plan   SKIN CANCER SCREENING PERFORMED TODAY.  ACTINIC DAMAGE WITH PRECANCEROUS ACTINIC KERATOSES Counseling for Topical Chemotherapy Management: Patient exhibits: - Severe, confluent actinic changes with pre-cancerous actinic keratoses that is secondary to cumulative UV radiation exposure over time - Condition that is severe; chronic, not at goal. - diffuse scaly erythematous macules and papules with underlying dyspigmentation - Discussed Prescription Field Treatment topical Chemotherapy for Severe, Chronic Confluent Actinic Changes with Pre-Cancerous Actinic Keratoses Field treatment involves treatment of an entire area of  skin that has confluent Actinic Changes (Sun/ Ultraviolet light damage) and PreCancerous Actinic Keratoses by method of PhotoDynamic Therapy (PDT) and/or prescription Topical Chemotherapy agents such as 5-fluorouracil, 5-fluorouracil/calcipotriene, and/or imiquimod.  The purpose is to decrease the number of clinically evident and subclinical PreCancerous lesions to prevent progression to development of skin cancer by chemically destroying early precancer changes that may or may not be visible.  It has been shown to reduce the risk of developing skin cancer in the treated area. As a result of treatment, redness, scaling, crusting, and open sores may occur during treatment course. One or more than one of these methods may be used and may have to be used several times to control, suppress and eliminate the PreCancerous changes. Discussed treatment course, expected reaction, and possible side effects. - Recommend daily broad spectrum sunscreen SPF 30+ to sun-exposed areas, reapply every 2 hours as needed.  - Staying in the shade or wearing long sleeves, sun glasses (UVA+UVB protection) and wide brim hats (4-inch brim around the entire circumference of the hat) are also recommended. - Call for new or changing lesions. - Patient defers 5FU and PDT treatment due to severe allergies.   LENTIGINES, SEBORRHEIC KERATOSES, HEMANGIOMAS - Benign normal skin lesions - Benign-appearing - Call for any changes  MELANOCYTIC NEVI - Tan-brown and/or pink-flesh-colored symmetric macules and papules, including left forearm - Benign appearing on exam today - Observation - Call clinic for new or changing moles - Recommend daily use of broad spectrum spf 30+ sunscreen to sun-exposed areas.   HISTORY OF SQUAMOUS CELL CARCINOMA OF THE SKIN L medial canthus, R dorsal hand  - No evidence of recurrence today -  Recommend regular full body skin exams - Recommend daily broad spectrum sunscreen SPF 30+ to sun-exposed areas,  reapply every 2 hours as needed.  - Call if any new or changing lesions are noted between office visits  SEBORRHEIC KERATOSIS vrs LENTIGO L med lower eyelid near prior [facility] Exam: 5mm tan slightly waxy indistinct macule, photo compared from 04/14/2021. Clear.    Treatment: Benign-appearing. Improved compared to previous visit/photo. Observation.  Call clinic for new or changing moles.  Recommend daily use of broad spectrum spf 30+ sunscreen to sun-exposed areas.  AK (ACTINIC KERATOSIS) (9) L upper lip x1, L chin x 1, R mid cheek x 1, L cheek x 1, L nasal tip x 1, L upper eyebrow x 1, L upper eyelid x 1, chest x 2 (9) Actinic keratoses are precancerous spots that appear secondary to cumulative UV radiation exposure/sun exposure over time. They are chronic with expected duration over 1 year. A portion of actinic keratoses will progress to squamous cell carcinoma of the skin. It is not possible to reliably predict which spots will progress to skin cancer and so treatment is recommended to prevent development of skin cancer.  Recommend daily broad spectrum sunscreen SPF 30+ to sun-exposed areas, reapply every 2 hours as needed.  Recommend staying in the shade or wearing long sleeves, sun glasses (UVA+UVB protection) and wide brim hats (4-inch brim around the entire circumference of the hat). Call for new or changing lesions.  Pt decline topical field treatment due to being highly allergic to many things.  Doesn't want to risk it. Destruction of lesion - L upper lip x1, L chin x 1, R mid cheek x 1, L cheek x 1, L nasal tip x 1, L upper eyebrow x 1, L upper eyelid x 1, chest x 2 (9)  Destruction method: cryotherapy   Informed consent: discussed and consent obtained   Lesion destroyed using liquid nitrogen: Yes   Region frozen until ice ball extended beyond lesion: Yes   Outcome: patient tolerated procedure well with no complications   Post-procedure details: wound care instructions given    Additional details:  Prior to procedure, discussed risks of blister formation, small wound, skin dyspigmentation, or rare scar following cryotherapy. Recommend Vaseline ointment to treated areas while healing.   Return in about 6 months (around 07/22/2024) for AKs; 1 year TBSE.  IAndrea Kerns, CMA, am acting as scribe for Rexene Rattler, MD   Documentation: I have reviewed the above documentation for accuracy and completeness, and I agree with the above.  Rexene Rattler, MD

## 2024-07-22 ENCOUNTER — Ambulatory Visit: Admitting: Dermatology

## 2025-01-27 ENCOUNTER — Encounter: Admitting: Dermatology
